# Patient Record
Sex: Female | Born: 1960 | Race: White | Hispanic: No | Marital: Married | Smoking: Never smoker
Health system: Southern US, Community
[De-identification: ages and names within clinical notes are randomized; demographics above are authoritative.]

## PROBLEM LIST (undated history)

## (undated) DIAGNOSIS — G43909 Migraine, unspecified, not intractable, without status migrainosus: Secondary | ICD-10-CM

## (undated) DIAGNOSIS — S82899A Other fracture of unspecified lower leg, initial encounter for closed fracture: Secondary | ICD-10-CM

## (undated) DIAGNOSIS — E785 Hyperlipidemia, unspecified: Secondary | ICD-10-CM

## (undated) DIAGNOSIS — K219 Gastro-esophageal reflux disease without esophagitis: Secondary | ICD-10-CM

## (undated) DIAGNOSIS — F419 Anxiety disorder, unspecified: Secondary | ICD-10-CM

## (undated) DIAGNOSIS — J45909 Unspecified asthma, uncomplicated: Secondary | ICD-10-CM

## (undated) DIAGNOSIS — T7840XA Allergy, unspecified, initial encounter: Secondary | ICD-10-CM

## (undated) DIAGNOSIS — I10 Essential (primary) hypertension: Secondary | ICD-10-CM

## (undated) HISTORY — DX: Allergy, unspecified, initial encounter: T78.40XA

## (undated) HISTORY — PX: CHOLECYSTECTOMY: SHX55

## (undated) HISTORY — DX: Migraine, unspecified, not intractable, without status migrainosus: G43.909

## (undated) HISTORY — DX: Anxiety disorder, unspecified: F41.9

## (undated) HISTORY — DX: Hyperlipidemia, unspecified: E78.5

## (undated) HISTORY — PX: HERNIA REPAIR: SHX51

## (undated) HISTORY — DX: Unspecified asthma, uncomplicated: J45.909

## (undated) HISTORY — DX: Essential (primary) hypertension: I10

## (undated) HISTORY — DX: Other fracture of unspecified lower leg, initial encounter for closed fracture: S82.899A

## (undated) HISTORY — PX: ABDOMINAL HYSTERECTOMY: SHX81

## (undated) HISTORY — DX: Gastro-esophageal reflux disease without esophagitis: K21.9

---

## 2001-05-26 ENCOUNTER — Ambulatory Visit (HOSPITAL_COMMUNITY): Admission: RE | Admit: 2001-05-26 | Discharge: 2001-05-26 | Payer: Self-pay | Admitting: General Surgery

## 2002-02-03 DIAGNOSIS — S82899A Other fracture of unspecified lower leg, initial encounter for closed fracture: Secondary | ICD-10-CM

## 2002-02-03 HISTORY — DX: Other fracture of unspecified lower leg, initial encounter for closed fracture: S82.899A

## 2002-02-04 ENCOUNTER — Emergency Department (HOSPITAL_COMMUNITY): Admission: EM | Admit: 2002-02-04 | Discharge: 2002-02-04 | Payer: Self-pay | Admitting: Emergency Medicine

## 2002-02-04 ENCOUNTER — Encounter: Payer: Self-pay | Admitting: Emergency Medicine

## 2002-06-01 ENCOUNTER — Ambulatory Visit (HOSPITAL_COMMUNITY): Admission: RE | Admit: 2002-06-01 | Discharge: 2002-06-01 | Payer: Self-pay | Admitting: Family Medicine

## 2002-06-01 ENCOUNTER — Encounter: Payer: Self-pay | Admitting: Family Medicine

## 2002-11-01 ENCOUNTER — Encounter: Payer: Self-pay | Admitting: Family Medicine

## 2002-11-01 ENCOUNTER — Ambulatory Visit (HOSPITAL_COMMUNITY): Admission: RE | Admit: 2002-11-01 | Discharge: 2002-11-01 | Payer: Self-pay | Admitting: Family Medicine

## 2004-01-24 ENCOUNTER — Ambulatory Visit (HOSPITAL_COMMUNITY): Admission: RE | Admit: 2004-01-24 | Discharge: 2004-01-24 | Payer: Self-pay | Admitting: Family Medicine

## 2005-03-25 ENCOUNTER — Ambulatory Visit (HOSPITAL_COMMUNITY): Admission: RE | Admit: 2005-03-25 | Discharge: 2005-03-25 | Payer: Self-pay | Admitting: Family Medicine

## 2006-04-11 ENCOUNTER — Ambulatory Visit (HOSPITAL_COMMUNITY): Admission: RE | Admit: 2006-04-11 | Discharge: 2006-04-11 | Payer: Self-pay | Admitting: Family Medicine

## 2007-01-06 ENCOUNTER — Ambulatory Visit (HOSPITAL_COMMUNITY): Admission: RE | Admit: 2007-01-06 | Discharge: 2007-01-06 | Payer: Self-pay | Admitting: Family Medicine

## 2007-01-17 ENCOUNTER — Encounter (HOSPITAL_COMMUNITY): Admission: RE | Admit: 2007-01-17 | Discharge: 2007-02-16 | Payer: Self-pay | Admitting: Family Medicine

## 2007-08-09 ENCOUNTER — Ambulatory Visit (HOSPITAL_COMMUNITY): Admission: RE | Admit: 2007-08-09 | Discharge: 2007-08-09 | Payer: Self-pay | Admitting: Family Medicine

## 2008-08-21 ENCOUNTER — Ambulatory Visit (HOSPITAL_COMMUNITY): Admission: RE | Admit: 2008-08-21 | Discharge: 2008-08-21 | Payer: Self-pay | Admitting: Family Medicine

## 2009-03-21 IMAGING — CT CT ABDOMEN W/ CM
1 of 3 series · 12 of 32 positions shown, 18 images · IV contrast (omnipaque)
Comparison: Ultrasound of 01/06/07.

CLINICAL DATA: 46-year-old with left upper quadrant abdominal pain, diarrhea, nausea after eating, abnormal ultrasound. 
 ABDOMEN CT WITH CONTRAST:
TECHNIQUE: Multidetector CT imaging of the abdomen was performed following the standard protocol during bolus administration of intravenous contrast.
 Contrast:  100 cc Omnipaque 300.

[Series 6: kidney delay 5.0 b40f · axial · delayed · 0.69mm/px · z∈[-297,-57]mm · 12 of 58 slices shown, 18 images]
[im 5/58  soft-tissue]
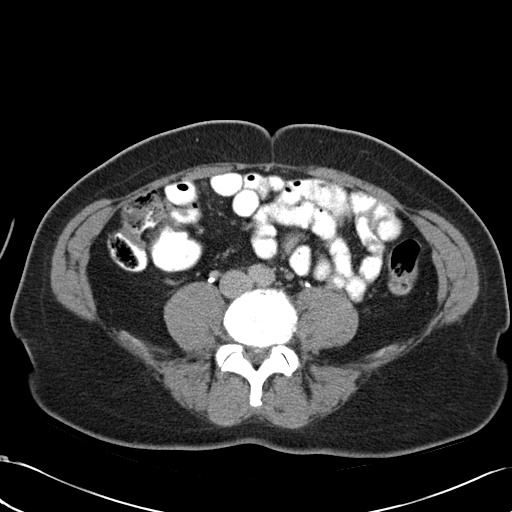
[im 5/58  bone]
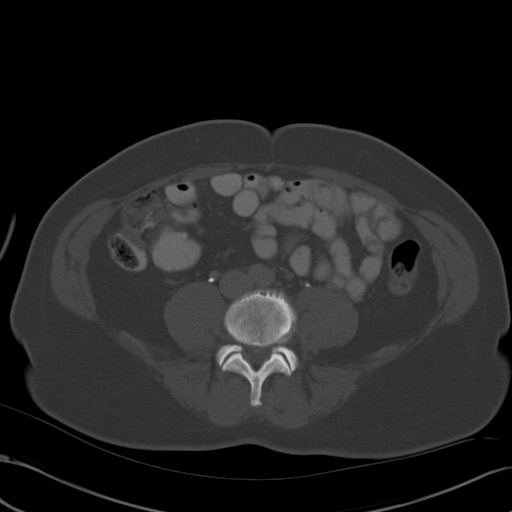
[im 9/58  soft-tissue]
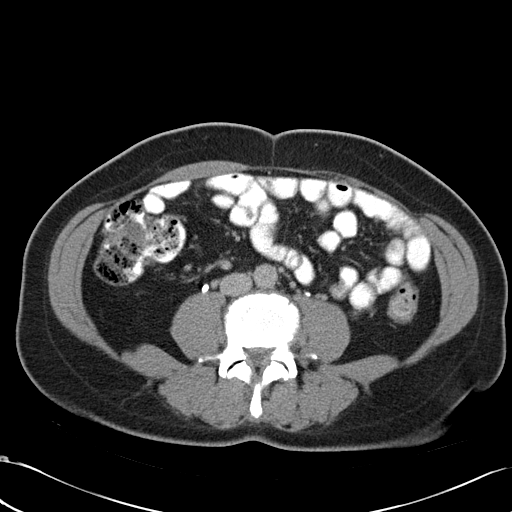
[im 14/58  soft-tissue]
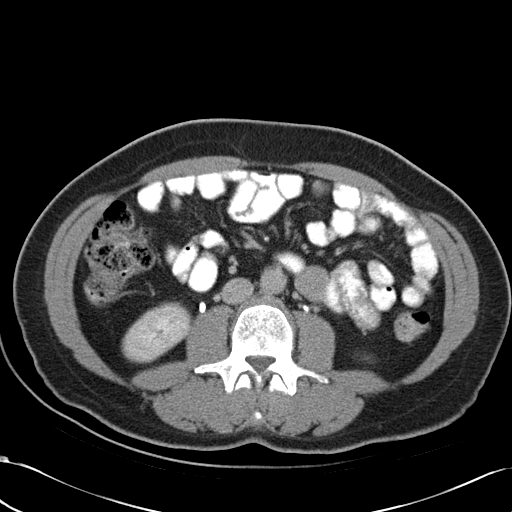
[im 18/58  soft-tissue]
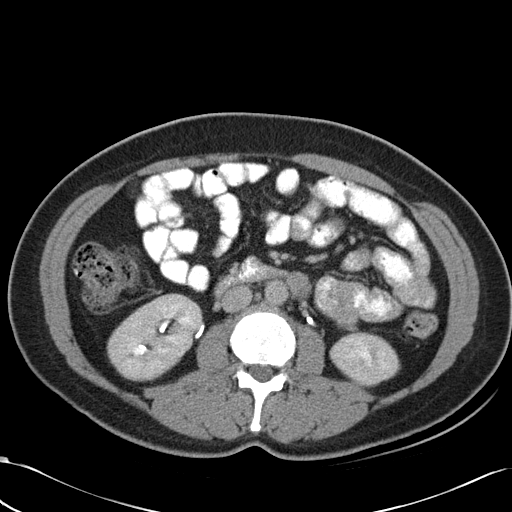
[im 22/58  soft-tissue]
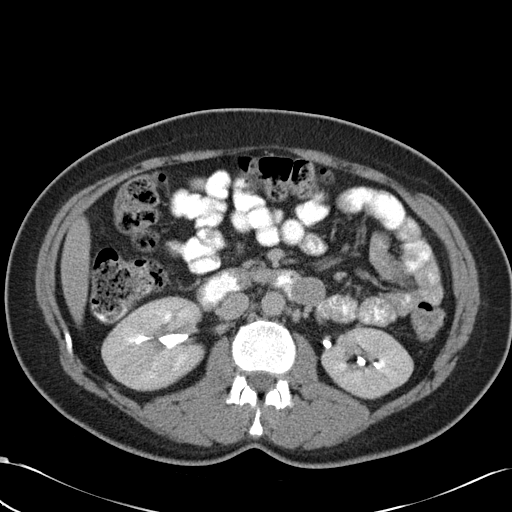
[im 27/58  soft-tissue]
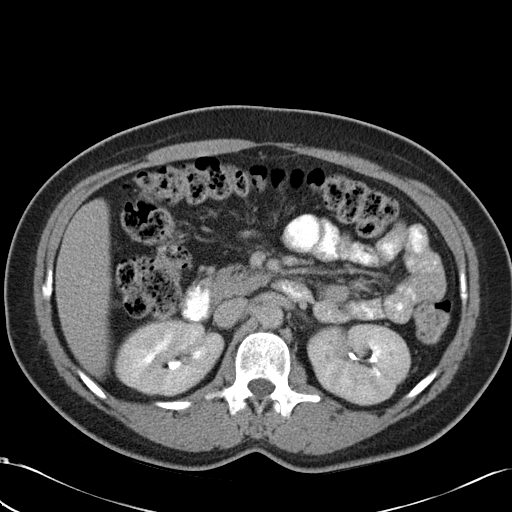
[im 31/58  soft-tissue]
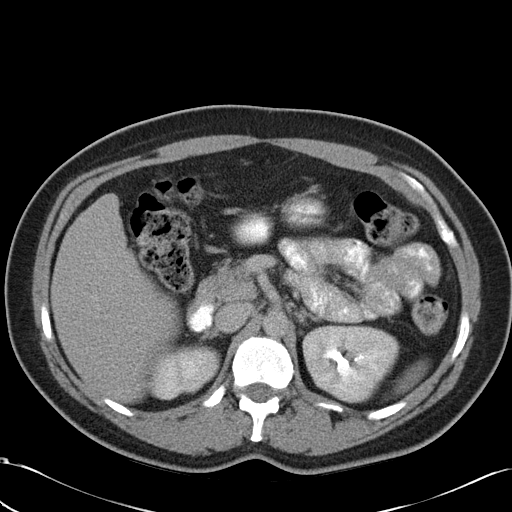
[im 36/58  soft-tissue]
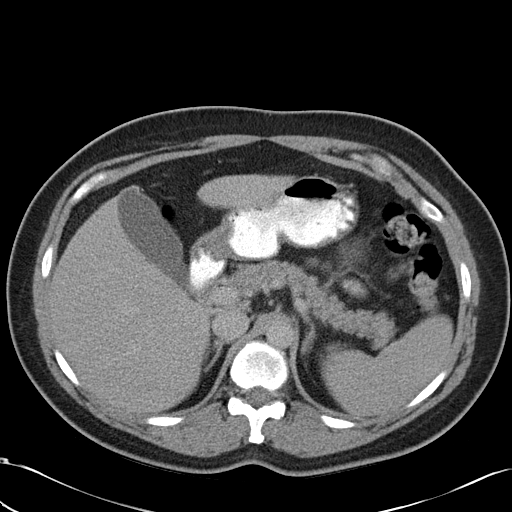
[im 40/58  soft-tissue]
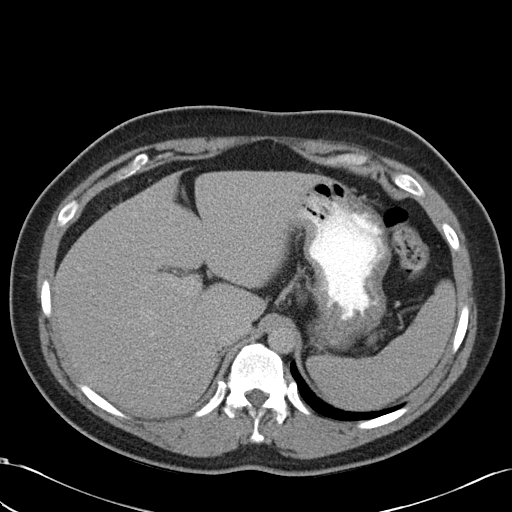
[im 40/58  lung]
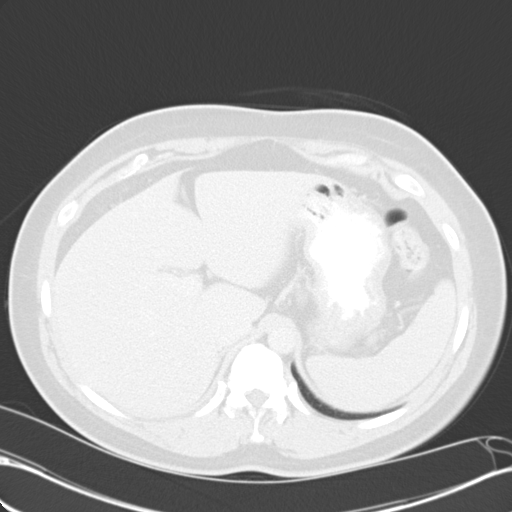
[im 40/58  bone]
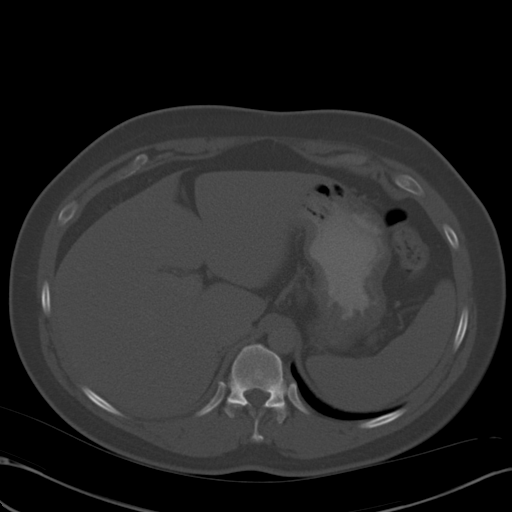
[im 44/58  soft-tissue]
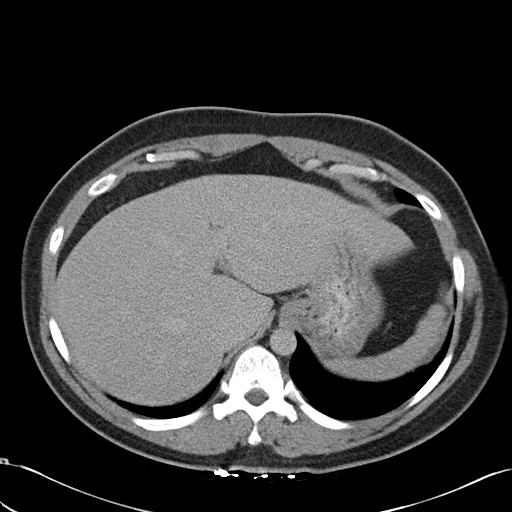
[im 44/58  lung]
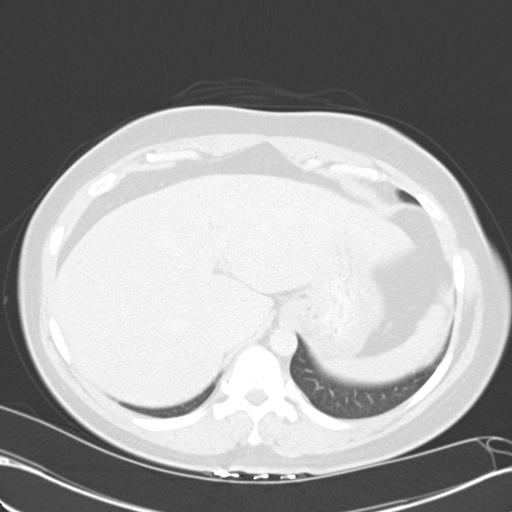
[im 49/58  soft-tissue]
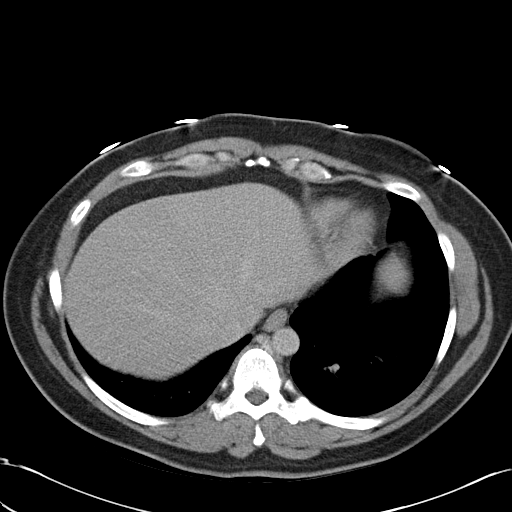
[im 49/58  lung]
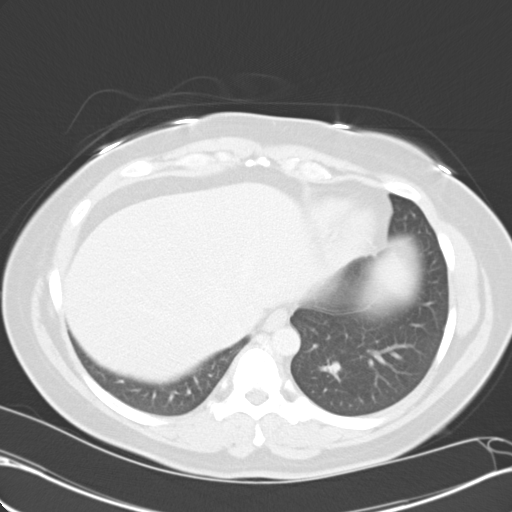
[im 53/58  soft-tissue]
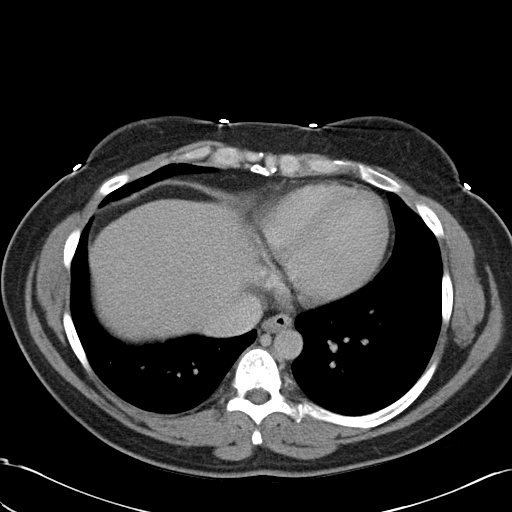
[im 53/58  lung]
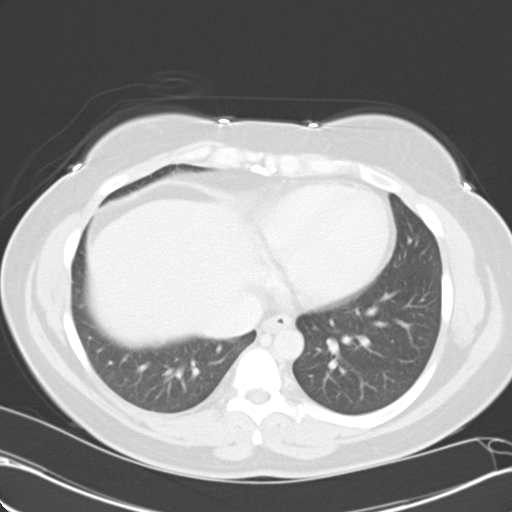

[12 of 32 positions shown; findings below may reference images not displayed]

FINDINGS: The lung bases are clear.  
 There is mild diffuse fatty infiltration of the liver, but no focal hepatic lesions or intrahepatic biliary dilatation.  There is mild impression on the far left lateral lobe of the liver due to the diaphragm.  I do not see any worrisome nodules or masses.  There is one tiny calcified granuloma in the left hepatic lobe and another tiny lesion in the right hepatic lobe.  The gallbladder appears normal.  The spleen is normal in size.  Small accessory spleen is noted.  The pancreas is unremarkable.  The adrenal glands and kidneys are normal.  
 The stomach, duodenum, small bowel and colon are unremarkable. 
 The aorta is normal in caliber.  No dissection.  No mesenteric or retroperitoneal masses or adenopathy.  No significant bony findings.
IMPRESSION: 1. Two small calcified granulomas in the liver but no worrisome hepatic lesions or biliary dilatation.  I think the findings on the ultrasound are due to diaphragmatic impressions on the far lateral aspect of the left hepatic lobe.  
 2. No abdominal mass lesions or adenopathy.

## 2009-04-24 ENCOUNTER — Ambulatory Visit (HOSPITAL_COMMUNITY): Admission: RE | Admit: 2009-04-24 | Discharge: 2009-04-24 | Payer: Self-pay | Admitting: Family Medicine

## 2010-01-22 ENCOUNTER — Ambulatory Visit (HOSPITAL_COMMUNITY): Admission: RE | Admit: 2010-01-22 | Discharge: 2010-01-22 | Payer: Self-pay | Admitting: Family Medicine

## 2010-11-19 IMAGING — US US TRANSVAGINAL NON-OB
1 series · 13 of 25 positions shown · non-contrast
Comparison: None.

CLINICAL DATA: Abdominal pain.  Heavy clotting during menses.  LMP
04/06/2009

TRANSABDOMINAL AND TRANSVAGINAL ULTRASOUND OF PELVIS
TECHNIQUE: Both transabdominal and transvaginal ultrasound
examinations of the pelvis were performed including evaluation of
the uterus, ovaries, adnexal regions, and pelvic cul-de-sac.

[Series 1: us transvaginal non-ob · 0.26mm/px · 13 of 93 slices shown]
[im 1/93]
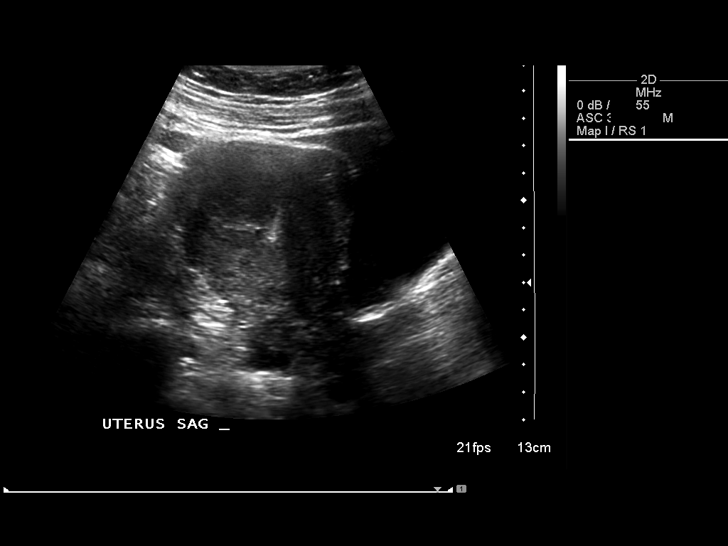
[im 8/93]
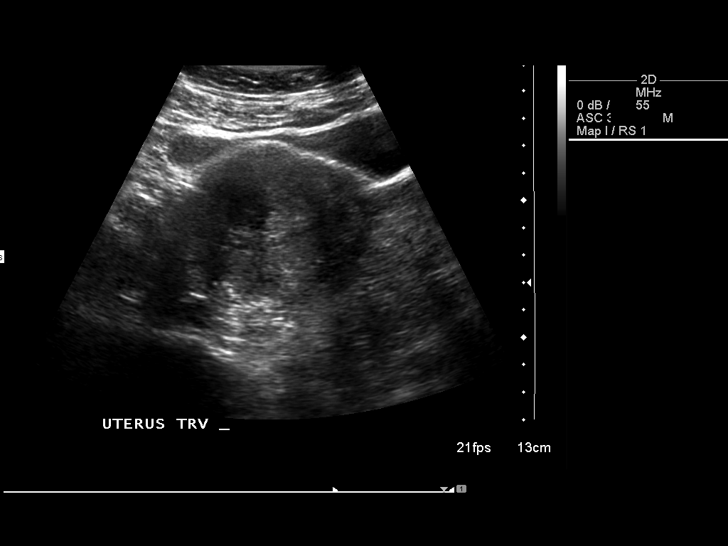
[im 16/93]
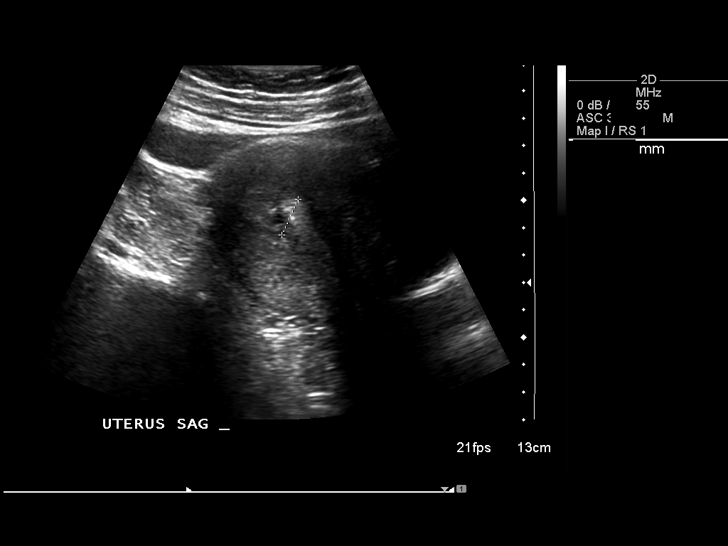
[im 24/93]
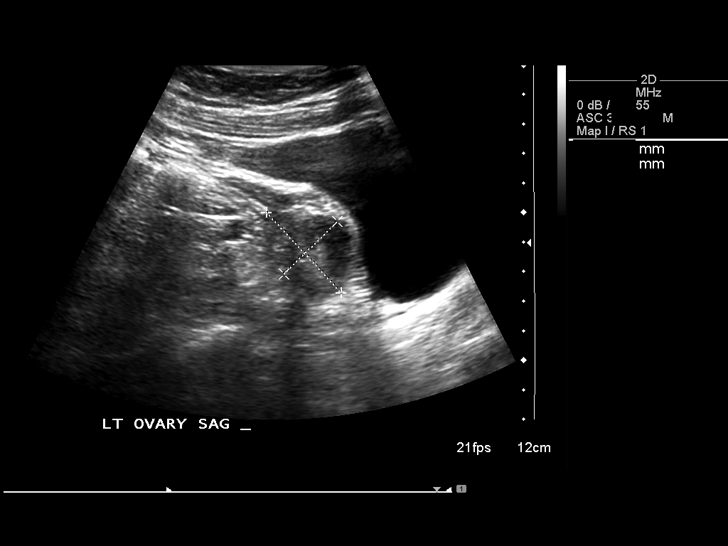
[im 31/93]
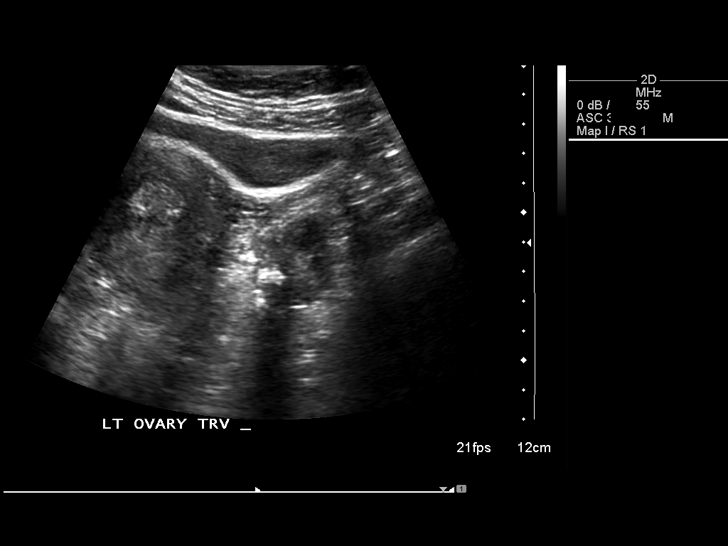
[im 39/93]
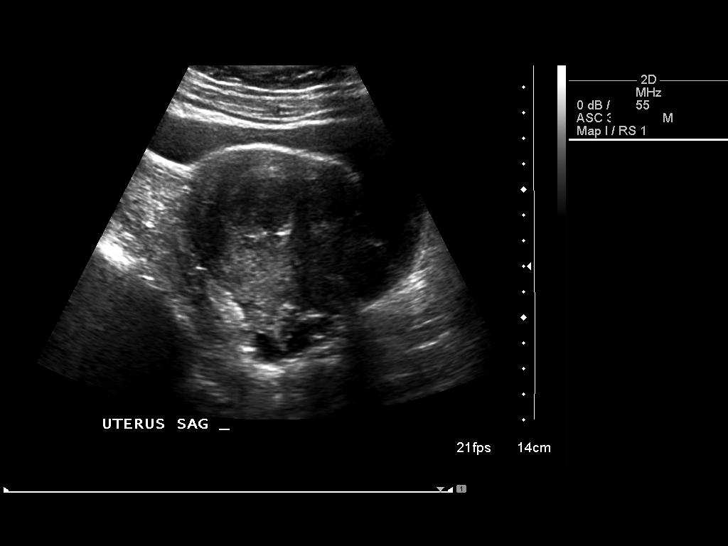
[im 47/93]
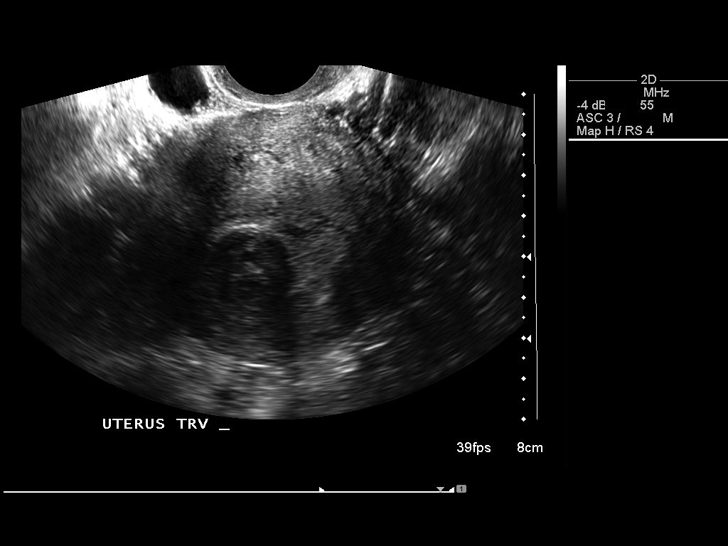
[im 54/93]
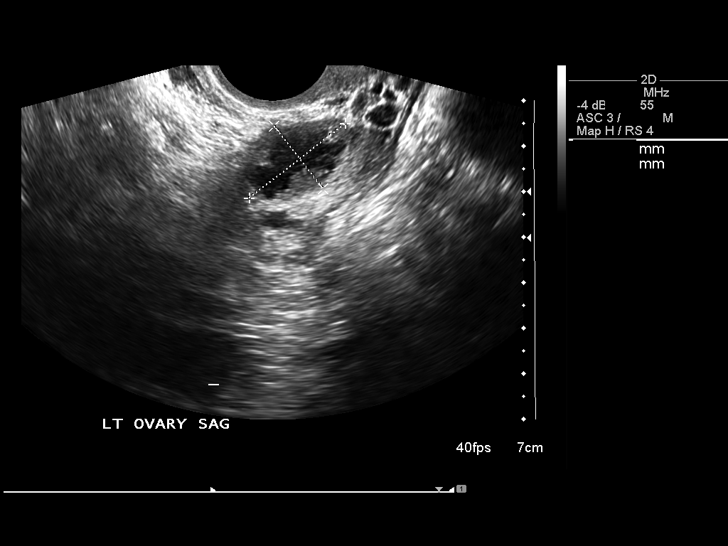
[im 62/93]
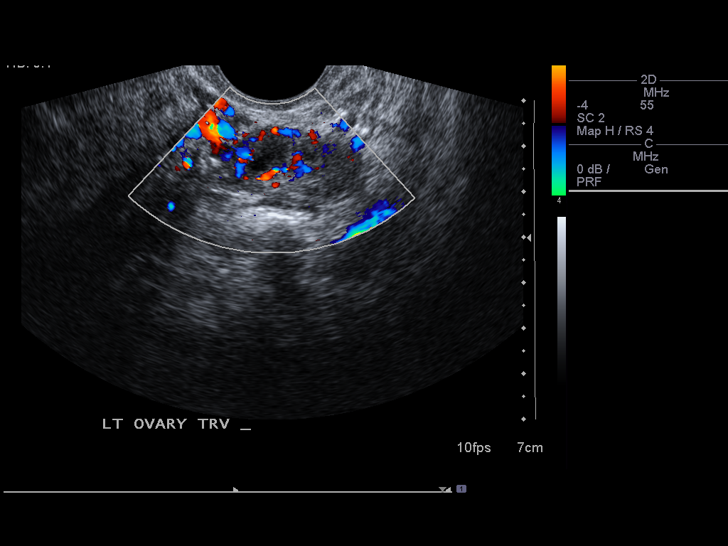
[im 70/93]
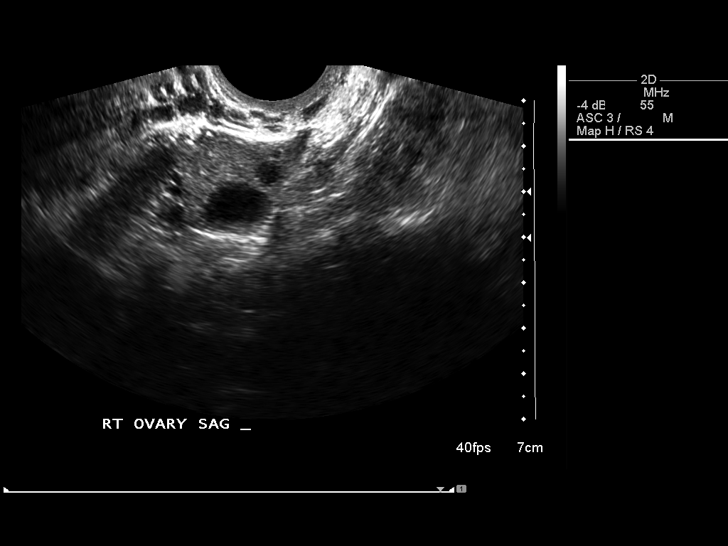
[im 77/93]
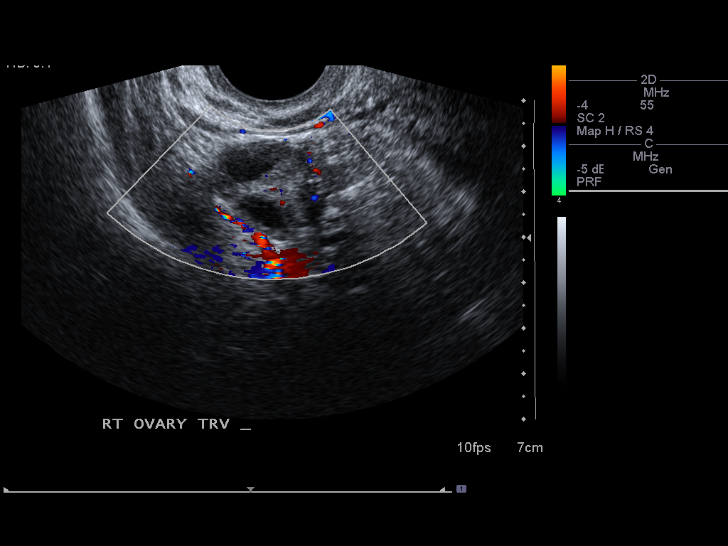
[im 85/93]
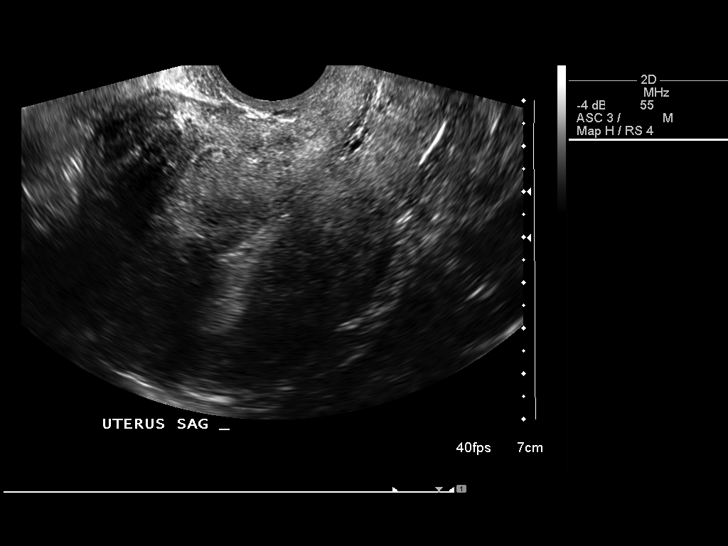
[im 93/93]
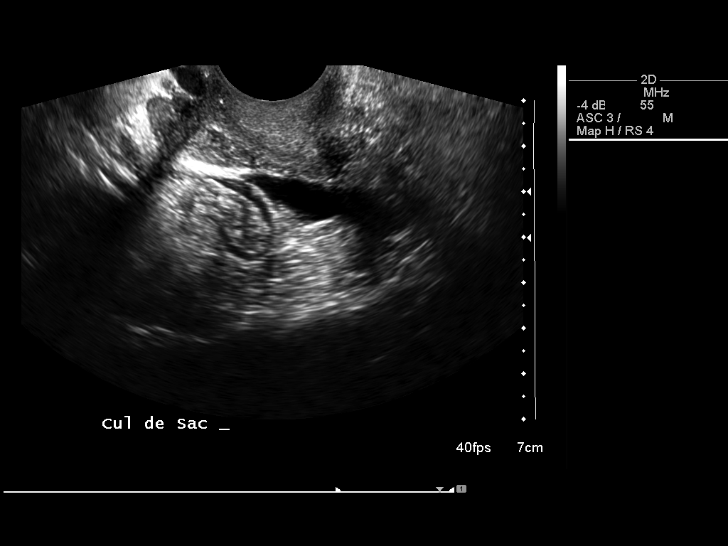

[13 of 25 positions shown; findings below may reference images not displayed]

FINDINGS: Uterus the uterus demonstrates a sagittal length of 10.7 cm, an AP
width of 6.7 cm and a transverse width of 7.3 cm.  Focal fibroids
are identified in the anterior upper uterine segment measuring
x 2.7 x 2.3 cm and mural in location, in the central upper uterine
segment measuring 2.3 x 2.2 by 1.9 cm with a significant submucosal
component suggested.

Endometrium the fundal portion of the endometrial lining is splayed
by the aforementioned fibroid with submucosal component.  An
accurate assessment of the endometrial lining is subsequently
compromised.  Further assessment of the degree of submucosal
involvement can be obtained with sono hysterography if desired.

Right Ovary has a normal appearance measuring 2.5 by 3.9 x 2.3 cm

Left Ovary measures 2.7 by 4.1 x 2.4 cm and contains a small
collapsing complex cyst demonstrating peripheral flow and likely
representing a hemorrhagic corpus luteum given the patient's LMP of
04/06/2009 and anticipated presecretory phase of the cycle.

Other Findings:  A small amount of simple free fluid is noted in
the cul-de-sac.  No separate adnexal masses are seen.
IMPRESSION: Fibroid uterus with at least one fibroid demonstrating a
significant submucosal component.  The degree of submucosal
involvement can be assessed with sono hysterography.  This should
be performed in the postsecretory phase of the cycle to allow
accurate evaluation of the endometrial lining as well.

Normal right ovary.  Probable hemorrhagic corpus luteum on the
left.  This can be assessed for resolution at the time of
sonohysterography if this is performed, or with rescanning in 4-6
weeks to confirm a functional nature to this finding.

## 2011-01-27 ENCOUNTER — Other Ambulatory Visit: Payer: Self-pay | Admitting: Family Medicine

## 2011-01-27 DIAGNOSIS — Z139 Encounter for screening, unspecified: Secondary | ICD-10-CM

## 2011-01-28 ENCOUNTER — Ambulatory Visit (HOSPITAL_COMMUNITY)
Admission: RE | Admit: 2011-01-28 | Discharge: 2011-01-28 | Disposition: A | Discharge status: Home / Self Care | Payer: BC Managed Care – PPO | Source: Ambulatory Visit | Attending: Family Medicine | Admitting: Family Medicine

## 2011-01-28 DIAGNOSIS — Z139 Encounter for screening, unspecified: Secondary | ICD-10-CM

## 2011-01-28 DIAGNOSIS — Z1231 Encounter for screening mammogram for malignant neoplasm of breast: Secondary | ICD-10-CM | POA: Insufficient documentation

## 2011-07-29 ENCOUNTER — Other Ambulatory Visit (INDEPENDENT_AMBULATORY_CARE_PROVIDER_SITE_OTHER): Payer: Self-pay | Admitting: *Deleted

## 2011-07-29 ENCOUNTER — Telehealth (INDEPENDENT_AMBULATORY_CARE_PROVIDER_SITE_OTHER): Payer: Self-pay | Admitting: *Deleted

## 2011-07-29 DIAGNOSIS — Z1211 Encounter for screening for malignant neoplasm of colon: Secondary | ICD-10-CM

## 2011-07-29 DIAGNOSIS — Z800 Family history of malignant neoplasm of digestive organs: Secondary | ICD-10-CM

## 2011-07-29 MED ORDER — PEG-KCL-NACL-NASULF-NA ASC-C 100 G PO SOLR: 1.0000 | Freq: Once | ORAL | Status: DC

## 2011-07-29 NOTE — Telephone Encounter (Signed)
Patient needs movi prep 

## 2011-09-15 ENCOUNTER — Telehealth (INDEPENDENT_AMBULATORY_CARE_PROVIDER_SITE_OTHER): Payer: Self-pay | Admitting: *Deleted

## 2011-09-15 NOTE — Telephone Encounter (Signed)
PCP/Requesting MD: scott luking  Name & DOB: Jasiya Troop 2060-11-14     Procedure: tcs  Reason/Indication:  Screening, fam hx colon ca  Has patient had this procedure before?  yes  If so, when, by whom and where?  05/2001  Is there a family history of colon cancer?  yes  Who?  What age when diagnosed?  Maternal grandmother  Is patient diabetic?   no      Does patient have prosthetic heart valve?  no  Do you have a pacemaker?  no  Has patient had joint replacement within last 12 months?  no  Is patient on Coumadin, Plavix and/or Aspirin? no  Medications: pantoprazole 40 mg daily, fenofibrate 160 mg daily, multi vit, calcium, vit c, glucosamine  Allergies: sulfur  Medication Adjustment: none  Procedure date & time: 10/14/11 at 830

## 2011-09-17 NOTE — Telephone Encounter (Signed)
agree

## 2011-10-01 ENCOUNTER — Encounter (HOSPITAL_COMMUNITY): Payer: Self-pay | Admitting: Pharmacy Technician

## 2011-10-13 MED ORDER — SODIUM CHLORIDE 0.45 % IV SOLN
Freq: Once | INTRAVENOUS | Status: AC
  Administered 2011-10-14: 08:00:00 via INTRAVENOUS

## 2011-10-14 ENCOUNTER — Encounter (HOSPITAL_COMMUNITY)
Admission: RE | Disposition: A | Discharge status: Home / Self Care | Payer: Self-pay | Source: Ambulatory Visit | Attending: Internal Medicine

## 2011-10-14 ENCOUNTER — Encounter (HOSPITAL_COMMUNITY): Payer: Self-pay | Admitting: *Deleted

## 2011-10-14 ENCOUNTER — Ambulatory Visit (HOSPITAL_COMMUNITY)
Admission: RE | Admit: 2011-10-14 | Discharge: 2011-10-14 | Disposition: A | Discharge status: Home / Self Care | Payer: BC Managed Care – PPO | Source: Ambulatory Visit | Attending: Internal Medicine | Admitting: Internal Medicine

## 2011-10-14 DIAGNOSIS — Z1211 Encounter for screening for malignant neoplasm of colon: Secondary | ICD-10-CM | POA: Insufficient documentation

## 2011-10-14 DIAGNOSIS — Z800 Family history of malignant neoplasm of digestive organs: Secondary | ICD-10-CM

## 2011-10-14 DIAGNOSIS — K644 Residual hemorrhoidal skin tags: Secondary | ICD-10-CM

## 2011-10-14 DIAGNOSIS — D126 Benign neoplasm of colon, unspecified: Secondary | ICD-10-CM | POA: Insufficient documentation

## 2011-10-14 HISTORY — PX: COLONOSCOPY: SHX5424

## 2011-10-14 SURGERY — COLONOSCOPY
Anesthesia: Moderate Sedation

## 2011-10-14 MED ORDER — STERILE WATER FOR IRRIGATION IR SOLN
Status: DC | PRN
  Administered 2011-10-14: 09:00:00

## 2011-10-14 MED ORDER — MIDAZOLAM HCL 5 MG/5ML IJ SOLN
INTRAMUSCULAR | Status: AC
  Filled 2011-10-14: qty 10

## 2011-10-14 MED ORDER — MEPERIDINE HCL 50 MG/ML IJ SOLN
INTRAMUSCULAR | Status: AC
  Filled 2011-10-14: qty 1

## 2011-10-14 MED ORDER — MIDAZOLAM HCL 5 MG/5ML IJ SOLN
INTRAMUSCULAR | Status: DC | PRN
  Administered 2011-10-14: 3 mg via INTRAVENOUS
  Administered 2011-10-14 (×2): 2 mg via INTRAVENOUS
  Administered 2011-10-14: 1 mg via INTRAVENOUS
  Administered 2011-10-14: 2 mg via INTRAVENOUS

## 2011-10-14 MED ORDER — MEPERIDINE HCL 50 MG/ML IJ SOLN
INTRAMUSCULAR | Status: DC | PRN
  Administered 2011-10-14 (×2): 25 mg via INTRAVENOUS

## 2011-10-14 NOTE — Op Note (Signed)
COLONOSCOPY PROCEDURE REPORT  PATIENT:  Faith Mahoney  MR#:  [id number] Birthdate:  Sep 20, 1960, 51 y.o., female Endoscopist:  Dr. Malissa Hippo, MD Referred By:  Dr. Lilyan Punt, MD Procedure Date: 10/14/2011  Procedure:   Colonoscopy  Indications:  Patient is 51 year old Caucasian female who is undergoing screening colonoscopy. Family history is positive for colon carcinoma in two second-degree relatives late onset. Patient's last exam was 10 years ago  Informed Consent:  The procedure and risks were reviewed with the patient and informed consent was obtained.  Medications:  Demerol 50 mg IV Versed 10 mg IV  Description of procedure:  After a digital rectal exam was performed, that colonoscope was advanced from the anus through the rectum and colon to the area of the cecum, ileocecal valve and appendiceal orifice. The cecum was deeply intubated. These structures were well-seen and photographed for the record. From the level of the cecum and ileocecal valve, the scope was slowly and cautiously withdrawn. The mucosal surfaces were carefully surveyed utilizing scope tip to flexion to facilitate fold flattening as needed. The scope was pulled down into the rectum where a thorough exam including retroflexion was performed.  Findings:   Prep excellent. 3 mm polyp ablated via cold biopsy from splenic flexure. Rest of the colonic mucosa was normal. Normal rectal mucosa. Small hemorrhoids below the dentate line.  Therapeutic/Diagnostic Maneuvers Performed:  See above  Complications:  None  Cecal Withdrawal Time:  12 minutes  Impression:  Examination performed to cecum. 3 mm polyp of there are cold biopsy from splenic flexure. Small external hemorrhoids.  Recommendations:  Standard instructions given. I will contact patient with biopsy results.  Faith Mahoney U  10/14/2011 9:09 AM  CC: Dr. Lilyan Punt, MD & Dr. Bonnetta Barry ref. provider found

## 2011-10-14 NOTE — H&P (Signed)
Faith Mahoney is an 51 y.o. female.   Chief Complaint: Patient is here for colonoscopy. HPI: Patient is 51 year old Caucasian female who is here for screening colonoscopy. She denies abdominal pain change in bowel habits or rectal bleeding. Her last exam was 10 years ago. Family history is negative for colon carcinoma in a first-degree relative. Both maternal grandparents had colon carcinoma; maternal grandfather was in the 9s and grandmother was in her 13s.  Past Medical History  Diagnosis Date  . PONV (postoperative nausea and vomiting)     No past surgical history on file.  No family history on file. Social History:  does not have a smoking history on file. She does not have any smokeless tobacco history on file. Her alcohol and drug histories not on file.  Allergies:  Allergies  Allergen Reactions  . Sulfa Antibiotics Hives  . Peanuts (Peanut Oil) Itching and Rash    Can tolerate them every now and then    Medications Prior to Admission  Medication Sig Dispense Refill  . Calcium Carbonate-Vitamin D (CALCIUM + D PO) Take 1 tablet by mouth daily.      Marland Kitchen glucosamine-chondroitin 500-400 MG tablet Take 1 tablet by mouth daily.      Marland Kitchen Ketotifen Fumarate (ALLERGY EYE DROPS OP) Apply 1-2 drops to eye as needed. For allergic eyes      . loratadine (CLARITIN) 10 MG tablet Take 10 mg by mouth daily as needed. For allergies      . Menthol, Topical Analgesic, (BIOFREEZE ROLL-ON) 4 % GEL Apply 1 application topically as needed. For pain      . Multiple Vitamin (MULTIVITAMIN WITH MINERALS) TABS Take 1 tablet by mouth daily.      . naproxen sodium (ANAPROX) 220 MG tablet Take 220 mg by mouth every 8 (eight) hours as needed. For pain      . pantoprazole (PROTONIX) 40 MG tablet Take 40 mg by mouth daily.      . peg 3350 powder (MOVIPREP) SOLR Take 1 kit (100 g total) by mouth once.  1 kit  0  . vitamin C (ASCORBIC ACID) 500 MG tablet Take 500 mg by mouth daily.        No results found for  this or any previous visit (from the past 48 hour(s)). No results found.  ROS  Blood pressure 128/92, pulse 79, temperature 98.1 F (36.7 C), temperature source Oral, resp. rate 21, height 5\' 3"  (1.6 m), weight 158 lb (71.668 kg). Physical Exam  Constitutional: She appears well-developed and well-nourished.  HENT:  Mouth/Throat: Oropharynx is clear and moist.  Eyes: Conjunctivae are normal. No scleral icterus.  Neck: No thyromegaly present.  Cardiovascular: Normal rate, regular rhythm and normal heart sounds.   No murmur heard. Respiratory: Effort normal and breath sounds normal.  GI: Soft. She exhibits no distension and no mass. There is no tenderness.  Musculoskeletal: She exhibits no edema.  Lymphadenopathy:    She has no cervical adenopathy.  Neurological: She is alert.  Skin: Skin is warm and dry.     Assessment/Plan Screening colonoscopy. Family history of colon carcinoma in 2 second degree relatives late onset.  REHMAN,NAJEEB U 10/14/2011, 8:33 AM

## 2011-10-18 ENCOUNTER — Encounter (HOSPITAL_COMMUNITY): Payer: Self-pay | Admitting: Internal Medicine

## 2011-10-19 ENCOUNTER — Encounter (INDEPENDENT_AMBULATORY_CARE_PROVIDER_SITE_OTHER): Payer: Self-pay | Admitting: *Deleted

## 2012-01-25 ENCOUNTER — Other Ambulatory Visit: Payer: Self-pay | Admitting: Family Medicine

## 2012-01-25 DIAGNOSIS — Z139 Encounter for screening, unspecified: Secondary | ICD-10-CM

## 2012-02-01 ENCOUNTER — Ambulatory Visit (HOSPITAL_COMMUNITY)
Admission: RE | Admit: 2012-02-01 | Discharge: 2012-02-01 | Disposition: A | Discharge status: Home / Self Care | Payer: BC Managed Care – PPO | Source: Ambulatory Visit | Attending: Family Medicine | Admitting: Family Medicine

## 2012-02-01 DIAGNOSIS — Z139 Encounter for screening, unspecified: Secondary | ICD-10-CM

## 2012-02-01 DIAGNOSIS — Z1231 Encounter for screening mammogram for malignant neoplasm of breast: Secondary | ICD-10-CM | POA: Insufficient documentation

## 2012-02-01 LAB — HM MAMMOGRAPHY: HM MAMMO: NORMAL

## 2012-10-05 ENCOUNTER — Other Ambulatory Visit (HOSPITAL_COMMUNITY): Payer: Self-pay | Admitting: Family Medicine

## 2013-01-30 ENCOUNTER — Other Ambulatory Visit: Payer: Self-pay | Admitting: Family Medicine

## 2013-01-30 DIAGNOSIS — Z139 Encounter for screening, unspecified: Secondary | ICD-10-CM

## 2013-02-06 ENCOUNTER — Ambulatory Visit (HOSPITAL_COMMUNITY): Payer: BC Managed Care – PPO

## 2013-04-23 ENCOUNTER — Encounter: Payer: Self-pay | Admitting: *Deleted

## 2013-04-25 ENCOUNTER — Ambulatory Visit (INDEPENDENT_AMBULATORY_CARE_PROVIDER_SITE_OTHER): Payer: BC Managed Care – PPO | Admitting: Nurse Practitioner

## 2013-04-25 ENCOUNTER — Encounter: Payer: Self-pay | Admitting: Nurse Practitioner

## 2013-04-25 VITALS — BP 132/88 | Ht 63.0 in | Wt 170.8 lb

## 2013-04-25 DIAGNOSIS — K219 Gastro-esophageal reflux disease without esophagitis: Secondary | ICD-10-CM

## 2013-04-25 DIAGNOSIS — Z0000 Encounter for general adult medical examination without abnormal findings: Secondary | ICD-10-CM

## 2013-04-25 DIAGNOSIS — Z01419 Encounter for gynecological examination (general) (routine) without abnormal findings: Secondary | ICD-10-CM

## 2013-04-25 MED ORDER — PANTOPRAZOLE SODIUM 40 MG PO TBEC: 40.0000 mg | DELAYED_RELEASE_TABLET | Freq: Two times a day (BID) | ORAL | Status: DC

## 2013-04-25 MED ORDER — ALPRAZOLAM 0.5 MG PO TABS: 0.5000 mg | ORAL_TABLET | Freq: Two times a day (BID) | ORAL | Status: DC | PRN

## 2013-04-25 MED ORDER — FENOFIBRATE 160 MG PO TABS: 160.0000 mg | ORAL_TABLET | Freq: Every day | ORAL | Status: DC

## 2013-04-30 ENCOUNTER — Encounter: Payer: Self-pay | Admitting: Nurse Practitioner

## 2013-04-30 DIAGNOSIS — K219 Gastro-esophageal reflux disease without esophagitis: Secondary | ICD-10-CM | POA: Insufficient documentation

## 2013-04-30 DIAGNOSIS — E785 Hyperlipidemia, unspecified: Secondary | ICD-10-CM | POA: Insufficient documentation

## 2013-04-30 NOTE — Progress Notes (Signed)
   Subjective:    Patient ID: Faith Mahoney, female    DOB: 1961-02-22, 53 y.o.   MRN: [id number]  HPI Presents for wellness check up. Gets regular vision and dental exams. Would like to try to get Zostavax if insurance will cover. Gets labs at work. To send copy. Has had increased reflux lately. Taking daily Protonix. Some occas anxiety.    Review of Systems  Constitutional: Negative for activity change, appetite change and fatigue.  HENT: Negative for dental problem, ear pain, sinus pressure and sore throat.   Respiratory: Negative for cough, chest tightness, shortness of breath and wheezing.   Cardiovascular: Negative for chest pain and leg swelling.  Gastrointestinal: Negative for nausea, vomiting, abdominal pain, diarrhea and constipation.  Genitourinary: Negative for dysuria, urgency, frequency, vaginal discharge, enuresis, difficulty urinating and pelvic pain.  Psychiatric/Behavioral: The patient is nervous/anxious.        Objective:   Physical Exam  Vitals reviewed. Constitutional: She is oriented to person, place, and time. She appears well-developed. No distress.  HENT:  Right Ear: External ear normal.  Left Ear: External ear normal.  Mouth/Throat: Oropharynx is clear and moist.  Neck: Normal range of motion. Neck supple. No tracheal deviation present. No thyromegaly present.  Cardiovascular: Normal rate, regular rhythm and normal heart sounds.  Exam reveals no gallop.   No murmur heard. Pulmonary/Chest: Effort normal and breath sounds normal.  Abdominal: Soft. She exhibits no distension. There is no tenderness.  Genitourinary: Vagina normal. No vaginal discharge found.  Musculoskeletal: She exhibits no edema.  Lymphadenopathy:    She has no cervical adenopathy.  Neurological: She is alert and oriented to person, place, and time.  Skin: Skin is warm and dry. No rash noted.  Psychiatric: She has a normal mood and affect. Her behavior is normal.  Breast exam: no  masses noted; axillae no adenopathy. External GU: no lesions or rash. Bimanual exam: normal; no tenderness. Rectal exam: normal; no stool for hemoccult.        Assessment & Plan:  Well woman exam - Plan: POC Hemoccult Bld/Stl (3-Cd Home Screen)  GERD (gastroesophageal reflux disease)  Increase Protonix to BID. Call back in 2-3 weeks if no better. May go back to once a day dosing when better. Patient plans to schedule her mammogram. Send copy of labs. Recommend healthy diet, regular activity and vitamin D/calcium supplement. Next PE in one year. Routine follow-up in 6 months.

## 2013-12-11 ENCOUNTER — Other Ambulatory Visit (HOSPITAL_COMMUNITY): Payer: Self-pay | Admitting: Family Medicine

## 2014-02-22 ENCOUNTER — Encounter: Payer: Self-pay | Admitting: Family Medicine

## 2014-05-08 ENCOUNTER — Ambulatory Visit (INDEPENDENT_AMBULATORY_CARE_PROVIDER_SITE_OTHER): Payer: BLUE CROSS/BLUE SHIELD | Admitting: Nurse Practitioner

## 2014-05-08 ENCOUNTER — Encounter: Payer: Self-pay | Admitting: Nurse Practitioner

## 2014-05-08 VITALS — BP 148/94 | Ht 63.0 in | Wt 169.0 lb

## 2014-05-08 DIAGNOSIS — Z0000 Encounter for general adult medical examination without abnormal findings: Secondary | ICD-10-CM

## 2014-05-08 DIAGNOSIS — I10 Essential (primary) hypertension: Secondary | ICD-10-CM | POA: Insufficient documentation

## 2014-05-08 DIAGNOSIS — E785 Hyperlipidemia, unspecified: Secondary | ICD-10-CM

## 2014-05-08 MED ORDER — LISINOPRIL 5 MG PO TABS: 5.0000 mg | ORAL_TABLET | Freq: Every day | ORAL | Status: DC

## 2014-05-08 MED ORDER — ROSUVASTATIN CALCIUM 5 MG PO TABS: 5.0000 mg | ORAL_TABLET | Freq: Every day | ORAL | Status: DC

## 2014-05-08 MED ORDER — EPINEPHRINE 0.3 MG/0.3ML IJ SOAJ: 0.3000 mg | Freq: Once | INTRAMUSCULAR | Status: DC

## 2014-05-08 NOTE — Progress Notes (Signed)
Subjective:    Patient ID: Faith Mahoney, female    DOB: 1960-11-09, 54 y.o.   MRN: [id number]  HPI presents for her wellness exam. Has had hysterectomy, no oophorectomy. Having "hot flashes" more at night but some during the day. While shopping the other day, had intense sweating, heart "pounding" and had to go home. No CP/ischemic type pain or SOB. Stressful job. Elevated BP outside of office. Healthy diet. Regular exercise. Regular vision and dental exams. Has labs with her today. Married, same sexual partner.    Review of Systems  Constitutional: Positive for fatigue. Negative for fever, activity change and appetite change.  HENT: Negative for dental problem, ear pain, sinus pressure and sore throat.   Respiratory: Negative for cough, chest tightness, shortness of breath and wheezing.   Cardiovascular: Positive for palpitations. Negative for chest pain.  Gastrointestinal: Negative for nausea, vomiting, abdominal pain, diarrhea, constipation, blood in stool and abdominal distention.  Genitourinary: Negative for dysuria, urgency, frequency, vaginal discharge, enuresis, difficulty urinating, genital sores and pelvic pain.       Objective:   Physical Exam  Constitutional: She is oriented to person, place, and time. She appears well-developed. No distress.  HENT:  Right Ear: External ear normal.  Left Ear: External ear normal.  Mouth/Throat: Oropharynx is clear and moist.  Neck: Normal range of motion. Neck supple. No tracheal deviation present. No thyromegaly present.  Cardiovascular: Normal rate, regular rhythm and normal heart sounds.  Exam reveals no gallop.   No murmur heard. Pulmonary/Chest: Effort normal and breath sounds normal.  Abdominal: Soft. She exhibits no distension. There is no tenderness.  Genitourinary: Vagina normal. No vaginal discharge found.  External GU: no rashes or lesions. Vagina: no discharge. Bimanual exam: no tenderness or obvious masses. Rectal exam:  no masses; no stool for hemoccult.   Musculoskeletal: She exhibits no edema.  Lymphadenopathy:    She has no cervical adenopathy.  Neurological: She is alert and oriented to person, place, and time.  Skin: Skin is warm and dry. No rash noted.  Psychiatric: She has a normal mood and affect. Her behavior is normal.  Vitals reviewed. Breast exam: slightly dense tissue; no masses; axillae no adenopathy.  Labs dated 04/11/14: TC 248; TG 251; HDL 32; LDL 166. See scanned labs.        Assessment & Plan:  Routine general medical examination at a health care facility  Essential hypertension  Hyperlipidemia  Discussed risks associated with uncontrolled HTN and lipids.  Meds ordered this encounter  Medications  . clobetasol cream (TEMOVATE) 0.05 %    Sig: Apply 1 application topically 2 (two) times daily.  . Omega-3 Fatty Acids (OMEGA 3 PO)    Sig: Take by mouth.  Marland Kitchen lisinopril (PRINIVIL,ZESTRIL) 5 MG tablet    Sig: Take 1 tablet (5 mg total) by mouth daily.    Dispense:  90 tablet    Refill:  1    Order Specific Question:  Supervising Provider    Answer:  Mikey Kirschner [[id number]]  . rosuvastatin (CRESTOR) 5 MG tablet    Sig: Take 1 tablet (5 mg total) by mouth at bedtime.    Dispense:  30 tablet    Refill:  2    Order Specific Question:  Supervising Provider    Answer:  Mikey Kirschner [[id number]]  . EPINEPHrine 0.3 mg/0.3 mL IJ SOAJ injection    Sig: Inject 0.3 mLs (0.3 mg total) into the muscle once.    Dispense:  1  Device    Refill:  2    Please dispense name brand; she has a coupon    Order Specific Question:  Supervising Provider    Answer:  Mikey Kirschner [[id number]]   Stop Fenofibrate. Because we will be switching labs next week, patient to call back for repeat lipid and liver profiles in 8-10 weeks. Monitor BP outside office. Discussed importance of stress reduction. Recommend natural supplements for hot flashes. Call back if persists. Return in about 6 months (around 11/06/2014)  for recheck BP and lipids. Next PE in one year.

## 2014-05-08 NOTE — Patient Instructions (Addendum)
**  CALL BACK IN 8-10 WEEKS FOR LABS THROUGH [facility]** Soy Flaxseed Black cohosh

## 2014-05-14 ENCOUNTER — Other Ambulatory Visit: Payer: Self-pay | Admitting: Family Medicine

## 2014-05-14 ENCOUNTER — Other Ambulatory Visit: Payer: Self-pay | Admitting: Nurse Practitioner

## 2014-05-14 NOTE — Telephone Encounter (Signed)
Last seen 05/08/14

## 2014-05-15 NOTE — Telephone Encounter (Signed)
May refill this +2 additional refills 

## 2014-08-02 ENCOUNTER — Telehealth: Payer: Self-pay | Admitting: Family Medicine

## 2014-08-02 DIAGNOSIS — R5383 Other fatigue: Secondary | ICD-10-CM

## 2014-08-02 DIAGNOSIS — E785 Hyperlipidemia, unspecified: Secondary | ICD-10-CM

## 2014-08-02 DIAGNOSIS — Z79899 Other long term (current) drug therapy: Secondary | ICD-10-CM

## 2014-08-02 NOTE — Telephone Encounter (Signed)
bw orders ready. Pt notified. 

## 2014-08-02 NOTE — Telephone Encounter (Signed)
Pt called requesting lab orders to be sent over. There are no prior labs in epic.

## 2014-08-02 NOTE — Telephone Encounter (Signed)
Met 7 liver lipid vit d

## 2015-02-21 ENCOUNTER — Encounter: Payer: Self-pay | Admitting: Family Medicine

## 2015-03-03 ENCOUNTER — Other Ambulatory Visit: Payer: Self-pay | Admitting: Nurse Practitioner

## 2015-05-05 ENCOUNTER — Encounter: Payer: BLUE CROSS/BLUE SHIELD | Admitting: Nurse Practitioner

## 2015-05-14 ENCOUNTER — Encounter: Payer: Self-pay | Admitting: Nurse Practitioner

## 2015-05-14 ENCOUNTER — Ambulatory Visit (INDEPENDENT_AMBULATORY_CARE_PROVIDER_SITE_OTHER): Payer: BLUE CROSS/BLUE SHIELD | Admitting: Nurse Practitioner

## 2015-05-14 VITALS — BP 148/96 | Temp 98.8°F | Ht 63.5 in | Wt 169.0 lb

## 2015-05-14 DIAGNOSIS — E785 Hyperlipidemia, unspecified: Secondary | ICD-10-CM

## 2015-05-14 DIAGNOSIS — R739 Hyperglycemia, unspecified: Secondary | ICD-10-CM

## 2015-05-14 DIAGNOSIS — Z0000 Encounter for general adult medical examination without abnormal findings: Secondary | ICD-10-CM

## 2015-05-14 LAB — POCT GLYCOSYLATED HEMOGLOBIN (HGB A1C): HEMOGLOBIN A1C: 5.4

## 2015-05-16 ENCOUNTER — Encounter: Payer: Self-pay | Admitting: Nurse Practitioner

## 2015-05-16 MED ORDER — ROSUVASTATIN CALCIUM 5 MG PO TABS: 5.0000 mg | ORAL_TABLET | Freq: Every day | ORAL | Status: DC

## 2015-05-16 MED ORDER — PANTOPRAZOLE SODIUM 40 MG PO TBEC: DELAYED_RELEASE_TABLET | ORAL | Status: DC

## 2015-05-16 MED ORDER — LISINOPRIL 5 MG PO TABS
5.0000 mg | ORAL_TABLET | Freq: Every day | ORAL | Status: DC
Start: 2015-05-16 — End: 2016-02-04

## 2015-05-16 NOTE — Progress Notes (Signed)
Subjective:    Patient ID: Faith Mahoney, female    DOB: [birth year], 55 y.o.   MRN: [id number]  HPI  Presents for her wellness exam. Married, same sexual partner. Has had a hysterectomy. Very active, regular exercise. Regular vision and dental exams. Takes daily multivitamin. Has been off her Crestor for about 8 months, has been taking red yeast rice. Caused moderate migratory muscle aches. Reflux stable; takes Protonix prn.     Review of Systems  Constitutional: Negative for activity change, appetite change and fatigue.  HENT: Negative for dental problem, ear pain, sinus pressure and sore throat.   Respiratory: Negative for cough, chest tightness, shortness of breath and wheezing.   Cardiovascular: Negative for chest pain.  Gastrointestinal: Negative for nausea, vomiting, abdominal pain, diarrhea, constipation, blood in stool and abdominal distention.  Genitourinary: Negative for dysuria, urgency, frequency, vaginal discharge, enuresis, difficulty urinating, genital sores and pelvic pain.       Objective:   Physical Exam  Constitutional: She is oriented to person, place, and time. She appears well-developed. No distress.  HENT:  Right Ear: External ear normal.  Left Ear: External ear normal.  Mouth/Throat: Oropharynx is clear and moist.  Neck: Normal range of motion. Neck supple. No tracheal deviation present. No thyromegaly present.  Cardiovascular: Normal rate, regular rhythm and normal heart sounds.  Exam reveals no gallop.   No murmur heard. Pulmonary/Chest: Effort normal and breath sounds normal.  Abdominal: Soft. She exhibits no distension. There is no tenderness.  Genitourinary: Vagina normal. No vaginal discharge found.  External GU no rashes or lesions. Vagina slightly pale with clear discharge. Bimanual exam no tenderness or obvious masses. Rectal exam no masses, stool for Hemoccult.  Musculoskeletal: She exhibits no edema.  Lymphadenopathy:    She has no cervical  adenopathy.  Neurological: She is alert and oriented to person, place, and time.  Skin: Skin is warm and dry. No rash noted.  Psychiatric: She has a normal mood and affect. Her behavior is normal.  Vitals reviewed. Breast exam: areas of dense tissue; no masses; axillae no adenopathy.  Labs from work dated 04/17/15: LDL 182; HDL 32; TC 250; TG 182. See scanned report.  Results for orders placed or performed in visit on 05/14/15  POCT glycosylated hemoglobin (Hb A1C)  Result Value Ref Range   Hemoglobin A1C 5.4         Assessment & Plan:   Problem List Items Addressed This Visit      Other   Hyperlipidemia (Chronic)   Relevant Medications   lisinopril (PRINIVIL,ZESTRIL) 5 MG tablet   rosuvastatin (CRESTOR) 5 MG tablet    Other Visit Diagnoses    Routine general medical examination at a health care facility    -  Primary    Relevant Orders    POC Hemoccult Bld/Stl (3-Cd Home Screen)    Hyperglycemia        Relevant Orders    POCT glycosylated hemoglobin (Hb A1C) (Completed)      Recommend restarting Crestor at low dose which has worked well in the past. Has tried multiple statins and fenofibrate. Discussed risk associated with long term uncontrolled cholesterol.  Meds ordered this encounter  Medications  . Red Yeast Rice Extract (RED YEAST RICE PO)    Sig: Take by mouth.  . Coenzyme Q10 (COQ10 PO)    Sig: Take by mouth.  Marland Kitchen lisinopril (PRINIVIL,ZESTRIL) 5 MG tablet    Sig: Take 1 tablet (5 mg total) by mouth daily.  Dispense:  30 tablet    Refill:  5    30 day only. Needs office visit    Order Specific Question:  Supervising Provider    Answer:  Mikey Kirschner [[id number]]  . pantoprazole (PROTONIX) 40 MG tablet    Sig: take 1 tablet by mouth twice a day before meals prn reflux    Dispense:  60 tablet    Refill:  5    Order Specific Question:  Supervising Provider    Answer:  Mikey Kirschner [[id number]]  . rosuvastatin (CRESTOR) 5 MG tablet    Sig: Take 1 tablet (5 mg  total) by mouth at bedtime.    Dispense:  30 tablet    Refill:  5    Order Specific Question:  Supervising Provider    Answer:  Mikey Kirschner [[id number]]   Return in about 6 months (around 11/11/2015) for recheck. Recommend lipid and liver profiles at that time.

## 2015-06-17 ENCOUNTER — Other Ambulatory Visit: Payer: Self-pay | Admitting: Family Medicine

## 2015-06-17 NOTE — Telephone Encounter (Signed)
Ok plus 2 ref 

## 2015-11-11 ENCOUNTER — Ambulatory Visit: Payer: BLUE CROSS/BLUE SHIELD | Admitting: Nurse Practitioner

## 2015-12-03 DIAGNOSIS — J0100 Acute maxillary sinusitis, unspecified: Secondary | ICD-10-CM | POA: Diagnosis not present

## 2015-12-03 DIAGNOSIS — Z23 Encounter for immunization: Secondary | ICD-10-CM | POA: Diagnosis not present

## 2015-12-03 DIAGNOSIS — H66001 Acute suppurative otitis media without spontaneous rupture of ear drum, right ear: Secondary | ICD-10-CM | POA: Diagnosis not present

## 2016-02-04 ENCOUNTER — Telehealth: Payer: Self-pay | Admitting: Family Medicine

## 2016-02-04 ENCOUNTER — Other Ambulatory Visit: Payer: Self-pay | Admitting: *Deleted

## 2016-02-04 MED ORDER — LISINOPRIL 5 MG PO TABS: 5.0000 mg | ORAL_TABLET | Freq: Every day | ORAL | 0 refills | Status: DC

## 2016-02-04 NOTE — Telephone Encounter (Signed)
Has appt this month for check up. 30 day supply sent to pharm. Pt notified.

## 2016-02-04 NOTE — Telephone Encounter (Signed)
Pt is needing a refill on her lisinopril (PRINIVIL,ZESTRIL) 5 MG tablet    [facility]

## 2016-02-19 ENCOUNTER — Ambulatory Visit (INDEPENDENT_AMBULATORY_CARE_PROVIDER_SITE_OTHER): Payer: BLUE CROSS/BLUE SHIELD | Admitting: Nurse Practitioner

## 2016-02-19 ENCOUNTER — Encounter: Payer: Self-pay | Admitting: Nurse Practitioner

## 2016-02-19 VITALS — BP 132/86 | Ht 64.0 in | Wt 173.0 lb

## 2016-02-19 DIAGNOSIS — E785 Hyperlipidemia, unspecified: Secondary | ICD-10-CM

## 2016-02-19 DIAGNOSIS — F419 Anxiety disorder, unspecified: Secondary | ICD-10-CM

## 2016-02-19 DIAGNOSIS — I10 Essential (primary) hypertension: Secondary | ICD-10-CM

## 2016-02-19 MED ORDER — ALPRAZOLAM 0.5 MG PO TABS: ORAL_TABLET | ORAL | 2 refills | Status: DC

## 2016-02-19 MED ORDER — LISINOPRIL 10 MG PO TABS: 10.0000 mg | ORAL_TABLET | Freq: Every day | ORAL | 1 refills | Status: DC

## 2016-02-19 NOTE — Patient Instructions (Signed)
Nitrous oxide

## 2016-02-19 NOTE — Progress Notes (Signed)
Subjective:  Presents for routine checkup on her blood pressure. Has increased her activity, now wearing a Fitbit and counting her steps. Healthy diet. Will have her lab work done through her employer in January. Recently stopped Crestor 5 mg due to extreme headaches which have resolved. No chest pain/ischemic type pain or shortness of breath. Has had extreme stress at her job. Also her husband had significant car accident several months ago which seemed to escalate her anxiety. Takes occasional Xanax only for extreme anxiety.   Objective:   BP 132/86   Ht 5\' 4"  (1.626 m)   Wt 173 lb (78.5 kg)   BMI 29.70 kg/m  NAD. Alert, oriented. Anxious affect. Thoughts logical coherent and relevant. Dressed appropriately. Taking good eye contact. Crying at times during office visit. Lungs clear. Heart regular rate rhythm. BP on recheck right arm sitting 172/118.    Assessment:  Problem List Items Addressed This Visit      Cardiovascular and Mediastinum   Hypertension - Primary   Relevant Medications   lisinopril (PRINIVIL,ZESTRIL) 10 MG tablet     Other   Anxiety   Relevant Medications   ALPRAZolam (XANAX) 0.5 MG tablet   Hyperlipidemia (Chronic)   Relevant Medications   lisinopril (PRINIVIL,ZESTRIL) 10 MG tablet        Plan:  Meds ordered this encounter  Medications  . ALPRAZolam (XANAX) 0.5 MG tablet    Sig: take 1 tablet by mouth twice a day if needed    Dispense:  30 tablet    Refill:  2    [facility] [place]    Order Specific Question:   Supervising Provider    Answer:   Mikey Kirschner [[id number]]  . lisinopril (PRINIVIL,ZESTRIL) 10 MG tablet    Sig: Take 1 tablet (10 mg total) by mouth daily.    Dispense:  90 tablet    Refill:  1    Order Specific Question:   Supervising Provider    Answer:   Mikey Kirschner [[id number]]   Increase lisinopril to 10 mg daily. Advised patient that it will most likely take more than this to get her blood pressure under decent control. Long discussion  about the role of stress and anxiety especially for her blood pressure. Patient defers daily medication as of today. To call back if she reconsiders. Also will need to review chart to find out what we may be able to use for cholesterol, patient has tried several medications. 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 Continue activity and weight loss efforts. Return in about 3 months (around 05/21/2016) for BP and cholesterol recheck, physical. Monitor BP outside the office. Bring lab work to her physical.

## 2016-03-10 ENCOUNTER — Telehealth: Payer: Self-pay | Admitting: Nurse Practitioner

## 2016-03-10 NOTE — Telephone Encounter (Addendum)
Faith Mahoney faxed over a letter from her Faith Mahoney Provider.  She would like for you to read over this and type up a letter for her stating the medical reason why she needs massages every 5-6 weeks.

## 2016-03-17 ENCOUNTER — Encounter: Payer: Self-pay | Admitting: Nurse Practitioner

## 2016-03-17 NOTE — Telephone Encounter (Signed)
Letter completed and given to [facility] to contact patient.

## 2016-03-18 ENCOUNTER — Telehealth: Payer: Self-pay | Admitting: Family Medicine

## 2016-03-18 NOTE — Telephone Encounter (Signed)
Message for Faith Mahoney) patient needing another letter for [facility] for massage and sinus therapy tablets as soon as you can please.

## 2016-03-31 ENCOUNTER — Encounter: Payer: Self-pay | Admitting: Nurse Practitioner

## 2016-03-31 NOTE — Telephone Encounter (Signed)
Letter is complete and will be given to front desk.

## 2016-04-13 ENCOUNTER — Encounter: Payer: Self-pay | Admitting: Family Medicine

## 2016-04-13 DIAGNOSIS — Z1231 Encounter for screening mammogram for malignant neoplasm of breast: Secondary | ICD-10-CM | POA: Diagnosis not present

## 2016-04-15 ENCOUNTER — Telehealth: Payer: Self-pay | Admitting: Family Medicine

## 2016-04-15 NOTE — Telephone Encounter (Signed)
Review mammogram screening faxed from [facility].

## 2016-04-18 NOTE — Telephone Encounter (Signed)
Mammogram negative-facility will be informing the patient

## 2016-05-20 ENCOUNTER — Encounter: Payer: Self-pay | Admitting: Nurse Practitioner

## 2016-05-20 ENCOUNTER — Ambulatory Visit (INDEPENDENT_AMBULATORY_CARE_PROVIDER_SITE_OTHER): Payer: BLUE CROSS/BLUE SHIELD | Admitting: Nurse Practitioner

## 2016-05-20 ENCOUNTER — Telehealth: Payer: Self-pay | Admitting: Nurse Practitioner

## 2016-05-20 VITALS — BP 122/88 | Ht 63.0 in | Wt 170.1 lb

## 2016-05-20 DIAGNOSIS — Z79899 Other long term (current) drug therapy: Secondary | ICD-10-CM

## 2016-05-20 DIAGNOSIS — Z0000 Encounter for general adult medical examination without abnormal findings: Secondary | ICD-10-CM

## 2016-05-20 DIAGNOSIS — E785 Hyperlipidemia, unspecified: Secondary | ICD-10-CM

## 2016-05-20 MED ORDER — TRIAMCINOLONE ACETONIDE 0.1 % EX CREA: 1.0000 "application " | TOPICAL_CREAM | Freq: Two times a day (BID) | CUTANEOUS | 0 refills | Status: DC

## 2016-05-20 MED ORDER — FENOFIBRATE 160 MG PO TABS: 160.0000 mg | ORAL_TABLET | Freq: Every day | ORAL | 2 refills | Status: DC

## 2016-05-20 MED ORDER — OMEGA-3-ACID ETHYL ESTERS 1 G PO CAPS: 2.0000 g | ORAL_CAPSULE | Freq: Two times a day (BID) | ORAL | 2 refills | Status: DC

## 2016-05-20 NOTE — Telephone Encounter (Signed)
Patient was just seen and noticed on AVS that prescriptions were being sent to the [facility] in [place] and she uses the one in [place].  I notified the nurse and they will change the RX's to go to [facility] in [place].  Patient notified to wait a little while before going to pick up meds.

## 2016-05-20 NOTE — Patient Instructions (Addendum)
Zaditor eye drops Start generic Lovaza for triglycerides; start one in the morning, then slowly work up to 2 twice a day if tolerated for total dose of 2 twice per day (if you can) Repeat labs in 3 months

## 2016-05-21 ENCOUNTER — Encounter: Payer: Self-pay | Admitting: Nurse Practitioner

## 2016-05-21 NOTE — Progress Notes (Signed)
Subjective:    Patient ID: Faith Mahoney, female    DOB: 08-02-1960, 56 y.o.   MRN: [id number]  HPI presents for her wellness exam. Same sexual partner. No pelvic pain. Regular vision and dental exams. Active lifestyle. Healthy diet. Continues to have significant stress.     Review of Systems  Constitutional: Negative for activity change, appetite change and fatigue.  HENT: Negative for dental problem, ear pain, sinus pressure and sore throat.   Respiratory: Negative for cough, chest tightness, shortness of breath and wheezing.   Cardiovascular: Negative for chest pain.  Gastrointestinal: Negative for abdominal distention, abdominal pain, blood in stool, constipation, diarrhea, nausea and vomiting.  Genitourinary: Negative for difficulty urinating, dysuria, enuresis, frequency, genital sores, pelvic pain, urgency and vaginal discharge.       Objective:   Physical Exam  Constitutional: She is oriented to person, place, and time. She appears well-developed. No distress.  HENT:  Right Ear: External ear normal.  Left Ear: External ear normal.  Mouth/Throat: Oropharynx is clear and moist.  Neck: Normal range of motion. Neck supple. No tracheal deviation present. No thyromegaly present.  Cardiovascular: Normal rate, regular rhythm and normal heart sounds.  Exam reveals no gallop.   No murmur heard. Pulmonary/Chest: Effort normal and breath sounds normal.  Abdominal: Soft. She exhibits no distension. There is no tenderness.  Genitourinary: Vagina normal. No vaginal discharge found.  Genitourinary Comments: External GU: no rashes or lesions. Vagina: no discharge. Bimanual exam: no tenderness or obvious masses.   Musculoskeletal: She exhibits no edema.  Lymphadenopathy:    She has no cervical adenopathy.  Neurological: She is alert and oriented to person, place, and time.  Skin: Skin is warm and dry. No rash noted.  Psychiatric: She has a normal mood and affect. Her behavior is  normal.  Vitals reviewed. Breast exam: minimal fine nodularity; no masses; axillae no adenopathy.  Has labs from work (see scanned report); TC 295; LDL 206; HDL 37 and TG 261 on 04/09/16.       Assessment & Plan:   Problem List Items Addressed This Visit      Other   Hyperlipidemia (Chronic)   Relevant Medications   omega-3 acid ethyl esters (LOVAZA) 1 g capsule   fenofibrate 160 MG tablet   Other Relevant Orders   Lipid panel    Other Visit Diagnoses    Routine general medical examination at a health care facility    -  Primary   High risk medication use       Relevant Orders   Hepatic function panel     Meds ordered this encounter  Medications  . DISCONTD: triamcinolone cream (KENALOG) 0.1 %    Sig: Apply 1 application topically 2 (two) times daily. Prn rash; use up to 2 weeks    Dispense:  30 g    Refill:  0    Order Specific Question:   Supervising Provider    Answer:   Mikey Kirschner [[id number]]  . DISCONTD: fenofibrate 160 MG tablet    Sig: Take 1 tablet (160 mg total) by mouth daily.    Dispense:  30 tablet    Refill:  2    Order Specific Question:   Supervising Provider    Answer:   Mikey Kirschner [[id number]]  . omega-3 acid ethyl esters (LOVAZA) 1 g capsule    Sig: Take 2 capsules (2 g total) by mouth 2 (two) times daily.    Dispense:  120 capsule  Refill:  2    Order Specific Question:   Supervising Provider    Answer:   Mikey Kirschner [[id number]]  . fenofibrate 160 MG tablet    Sig: Take 1 tablet (160 mg total) by mouth daily.    Dispense:  30 tablet    Refill:  2  . triamcinolone cream (KENALOG) 0.1 %    Sig: Apply 1 application topically 2 (two) times daily. Prn rash; use up to 2 weeks    Dispense:  30 g    Refill:  0   Restart Fenofibrate; unable to tolerate statins. Repeat lipid and liver profiles in 3 months; if not at goal, recommend referral to lipid clinic.  Start Lovaza to lower TG. Also recommend daily low dose ASA.  Return in about 6 months  (around 11/17/2016) for recheck.

## 2016-06-29 ENCOUNTER — Other Ambulatory Visit: Payer: Self-pay | Admitting: Nurse Practitioner

## 2016-07-12 DIAGNOSIS — J111 Influenza due to unidentified influenza virus with other respiratory manifestations: Secondary | ICD-10-CM | POA: Diagnosis not present

## 2016-07-12 DIAGNOSIS — J0100 Acute maxillary sinusitis, unspecified: Secondary | ICD-10-CM | POA: Diagnosis not present

## 2016-07-12 DIAGNOSIS — H66001 Acute suppurative otitis media without spontaneous rupture of ear drum, right ear: Secondary | ICD-10-CM | POA: Diagnosis not present

## 2016-07-12 DIAGNOSIS — R05 Cough: Secondary | ICD-10-CM | POA: Diagnosis not present

## 2016-11-17 ENCOUNTER — Encounter: Payer: Self-pay | Admitting: Nurse Practitioner

## 2016-11-17 ENCOUNTER — Ambulatory Visit (INDEPENDENT_AMBULATORY_CARE_PROVIDER_SITE_OTHER): Payer: BLUE CROSS/BLUE SHIELD | Admitting: Nurse Practitioner

## 2016-11-17 VITALS — BP 138/84 | Ht 63.0 in | Wt 170.6 lb

## 2016-11-17 DIAGNOSIS — E785 Hyperlipidemia, unspecified: Secondary | ICD-10-CM

## 2016-11-17 DIAGNOSIS — F419 Anxiety disorder, unspecified: Secondary | ICD-10-CM | POA: Diagnosis not present

## 2016-11-17 DIAGNOSIS — M2669 Other specified disorders of temporomandibular joint: Secondary | ICD-10-CM

## 2016-11-17 DIAGNOSIS — J329 Chronic sinusitis, unspecified: Secondary | ICD-10-CM

## 2016-11-17 DIAGNOSIS — I10 Essential (primary) hypertension: Secondary | ICD-10-CM

## 2016-11-17 DIAGNOSIS — K219 Gastro-esophageal reflux disease without esophagitis: Secondary | ICD-10-CM | POA: Diagnosis not present

## 2016-11-17 MED ORDER — AZITHROMYCIN 250 MG PO TABS: ORAL_TABLET | ORAL | 0 refills | Status: DC

## 2016-11-17 NOTE — Patient Instructions (Signed)
Nasacort AQ as directed 

## 2016-11-17 NOTE — Progress Notes (Signed)
Subjective:  Presents for routine follow up of HTN and cholesterol. BP doing much better with reduced stress at work. BP outside office 129-130/78-80s. Was doing power walking but has had to cut back due to a foot problem. Will make an appointment with podiatry. Takes one Omega 3 per day. Takes Protonix on a prn basis about every 3 days or so. Has tried to stop using it without success. C/o pain near the right jaw area radiating into the right ear. Mild head congestion. No fever, sore throat or cough. Slight yellow color to mucus.   Objective:   BP 138/84   Ht 5\' 3"  (1.6 m)   Wt 170 lb 9.6 oz (77.4 kg)   BMI 30.22 kg/m  NAD. Alert, oriented. TMs retracted, no erythema. Pharynx mildly injected with PND noted. Neck supple with mild anterior adenopathy. Distinct tenderness and tight muscles noted at right TMJ. Lungs clear. Heart RRR. Carotids no bruits or thrills. Abdomen soft, non distended, non tender. LE: no edema. Weight stable.   Assessment:   Problem List Items Addressed This Visit      Cardiovascular and Mediastinum   Hypertension - Primary   Relevant Orders   Hepatic function panel     Digestive   GERD (gastroesophageal reflux disease)     Other   Anxiety   Hyperlipidemia (Chronic)   Relevant Orders   Lipid panel    Other Visit Diagnoses    TMJ inflammation       Rhinosinusitis       Relevant Medications   azithromycin (ZITHROMAX Z-PAK) 250 MG tablet        Plan:   Meds ordered this encounter  Medications  . azithromycin (ZITHROMAX Z-PAK) 250 MG tablet    Sig: Take 2 tablets (500 mg) on  Day 1,  followed by 1 tablet (250 mg) once daily on Days 2 through 5.    Dispense:  6 each    Refill:  0    Order Specific Question:   Supervising Provider    Answer:   LUKING, WILLIAM S [[id number]]   Ice/heat applications and massage to right TMJ area. Encouraged podiatry evaluation and increased activity. Labs pending.  Add Nasacort to regimen. Call back if worsens or persists.   Return in about 6 months (around 05/20/2017) for physical. 25 minutes was spent with the patient. Greater than half the time was spent in discussion and answering questions and counseling regarding the issues that the patient came in for today.

## 2017-05-02 ENCOUNTER — Other Ambulatory Visit: Payer: Self-pay | Admitting: Nurse Practitioner

## 2017-05-25 ENCOUNTER — Encounter: Payer: BLUE CROSS/BLUE SHIELD | Admitting: Nurse Practitioner

## 2017-06-05 DIAGNOSIS — J029 Acute pharyngitis, unspecified: Secondary | ICD-10-CM | POA: Diagnosis not present

## 2017-06-05 DIAGNOSIS — H66001 Acute suppurative otitis media without spontaneous rupture of ear drum, right ear: Secondary | ICD-10-CM | POA: Diagnosis not present

## 2017-06-21 DIAGNOSIS — Z1231 Encounter for screening mammogram for malignant neoplasm of breast: Secondary | ICD-10-CM | POA: Diagnosis not present

## 2017-06-23 ENCOUNTER — Telehealth: Payer: Self-pay | Admitting: Family Medicine

## 2017-06-23 NOTE — Telephone Encounter (Signed)
Review screening mammogram results in results folder from [facility].

## 2017-06-24 ENCOUNTER — Encounter: Payer: Self-pay | Admitting: Nurse Practitioner

## 2017-06-24 ENCOUNTER — Ambulatory Visit (INDEPENDENT_AMBULATORY_CARE_PROVIDER_SITE_OTHER): Payer: BLUE CROSS/BLUE SHIELD | Admitting: Nurse Practitioner

## 2017-06-24 VITALS — BP 140/86 | Ht 63.0 in | Wt 172.2 lb

## 2017-06-24 DIAGNOSIS — Z01419 Encounter for gynecological examination (general) (routine) without abnormal findings: Secondary | ICD-10-CM

## 2017-06-24 DIAGNOSIS — F419 Anxiety disorder, unspecified: Secondary | ICD-10-CM

## 2017-06-24 DIAGNOSIS — Z0001 Encounter for general adult medical examination with abnormal findings: Secondary | ICD-10-CM

## 2017-06-24 DIAGNOSIS — E785 Hyperlipidemia, unspecified: Secondary | ICD-10-CM | POA: Diagnosis not present

## 2017-06-24 MED ORDER — SERTRALINE HCL 50 MG PO TABS: 50.0000 mg | ORAL_TABLET | Freq: Every day | ORAL | 2 refills | Status: DC

## 2017-06-24 MED ORDER — ALPRAZOLAM 0.5 MG PO TABS: ORAL_TABLET | ORAL | 2 refills | Status: DC

## 2017-06-24 NOTE — Progress Notes (Signed)
Subjective:    Patient ID: Faith Mahoney, female    DOB: 05/26/1960, 57 y.o.   MRN: [id number]  HPI Patient presents today for annual physical. She receives regular eye exams with an appt next week, regular dental exams every 6 months, and received a mammogram earlier this week. Patient received colonoscopy 4 years ago with instructions to return in 10 years. However, she reports increased stress at work that is affecting her sleep and daily functioning. She became tearful as she describes waking up 2-3x/week in the middle of the night with panicking, rapid respiratory rate, and shortness of breath. She reports using xanax during these episodes and gets relief. Additionally, she reports taking xanax at work when she is stressed or anxious, as well as, calming teas. She denies feelings of depression, self harm, or suicide.   Past Medical History:  Diagnosis Date  . Allergy   . Ankle fracture 02/2002  . Hyperlipidemia   . Hypertension   . Migraine   . PONV (postoperative nausea and vomiting)    Past Surgical History:  Procedure Laterality Date  . ABDOMINAL HYSTERECTOMY    . CHOLECYSTECTOMY    . COLONOSCOPY  10/14/2011   Procedure: COLONOSCOPY;  Surgeon: Rogene Houston, MD;  Location: [facility];  Service: Endoscopy;  Laterality: N/A;  9   Family History  Problem Relation Age of Onset  . Cancer Brother        bladder  . Cancer Maternal Grandmother 62       colon  . Diabetes Maternal Grandmother   . Heart disease Paternal Grandfather   . Hypertension Mother   . Hypertension Father   . Breast cancer Paternal Aunt   . Osteoporosis Paternal Grandmother   . Diabetes Paternal Grandmother   . Breast cancer Paternal Grandmother   . Breast cancer Paternal Aunt    Social History   Tobacco Use  . Smoking status: Never Smoker  . Smokeless tobacco: Never Used  Substance Use Topics  . Alcohol use: Not on file  . Drug use: No   -Living environment: Lives at home with  husband -Caffeine: 1-2 cups per day. Occasional green tea. Reduced coffee intake with increasing anxiety episodes.  -Exercise: Utilizes stand up desk at work, and walks numerous flights of stairs with occupation.    reports that she currently engages in sexual activity. She reports using the following method of birth control/protection: Surgical. Pt denies any new sexual partners. Husband is the only sexual partner.     Allergies  Allergen Reactions  . Sulfa Antibiotics Hives  . Peanuts [Peanut Oil] Itching and Rash    Can tolerate them every now and then  Seasonal allergies as well as dust.    Review of Systems -General: patient reports adequate energy level and stay active at work. However, she reports reduced walking due to bunions on left foot -HEENT: Patient denies vision changes, hearing changes, or nasal congestion. Pt denies sore throat, oral lesions, or difficulty swallowing.  -Cardiac: Denies chest pain, pressure, or palpitations.  -Pulmonary: Denies shortness of breath with the exception of anxiety attacks. Pt denies dyspnea or cough.  -GI: Patient reports regular soft bowel movements. Pt denies N/V/D or abd discomfort.  -GU: Denies frequency, urgency, or stress incontinence. Pt denies change in color or odor to urine.  -Breast: Pt denies tenderness, pain, or nodules. Pt denies nipple discharge, swelling, or tenderness -GYN: Patient dies itching or vaginal discomfort. Pt reports minimal vaginal discharge that is white, thin,  and non-malodorous -Musculoskeletal: Pt reports bunions to BLE. Pt reports L foot to be slightly painful with ambulation.  -Psych: Patient reports regular episodes of anxiety with night time awakenings. Patient denies depression symptoms, feelings of self harm, or suicidal ideation.  GAD 7 : Generalized Anxiety Score 06/24/2017  Nervous, Anxious, on Edge 3  Control/stop worrying 3  Worry too much - different things 3  Trouble relaxing 3  Restless 3   Easily annoyed or irritable 3  Afraid - awful might happen 3  Total GAD 7 Score 21  Anxiety Difficulty Somewhat difficult        Objective:   Physical Exam  General: WDWN. Appears calm until speaking about anxiety. Patient became tearful when describing anxiety symptoms.  HEENT: Ears - BIL external and cannal without erythema, exudate, or lesions. TM mild retraction without erythema or exudate. Bony landmarks visible. Throat - no lesions, erythema, or drainage. Thyroid- midline with no visible enlargement, nodules, masses on palpation.  Lymph: no evidence of adenopathy Cardiovascular: RRR. S1, S2 intact. No murmurs, gallops, or extra sounds.  Lungs: chest expansion equal. No use of accessory muscles. No adventitious breath sounds on auscultation.  Abdomen: Flat, soft, non-tender. No masses or tenderness on palpation  Breast: Symmetrical and smooth without nodules or masses. No erythema, swelling, or skin changes. Nipples symmetrical without discharge or deformity. Axillae no adenopathy.  GYN/GU: External- genitalia without erythema, lesions, swelling or masses. Urethra without erythema and midline. Clitoris without enlargement, lesions, or erythema. Vaginal mucosa pink without blood or discharge. Bimanual exam: No pelvic tenderness or masses on palpation. No lesions or hemorrhoids on external rectal examination.  Skin: Skin tags noted in BIL axillary folds and around around neck. No rash or open wounds. Redness to BIL bunions and callus on second metatarsal joint with contracture.   Vitals:   06/24/17 0919  BP: 140/86   Lipid Panel Total Cholesterol 276 Triglycerides 174 HDL 39 LDL 202     Assessment & Plan:   Problem List Items Addressed This Visit      Other   Hyperlipidemia (Chronic)   Relevant Orders   Ambulatory referral to Cardiology   Anxiety   Relevant Medications   sertraline (ZOLOFT) 50 MG tablet   ALPRAZolam (XANAX) 0.5 MG tablet    Other Visit Diagnoses     Well woman exam    -  Primary     Meds ordered this encounter  Medications  . sertraline (ZOLOFT) 50 MG tablet    Sig: Take 1 tablet (50 mg total) by mouth daily.    Dispense:  30 tablet    Refill:  2    Order Specific Question:   Supervising Provider    Answer:   Mikey Kirschner [[id number]]  . ALPRAZolam (XANAX) 0.5 MG tablet    Sig: take 1 tablet by mouth twice a day if needed    Dispense:  30 tablet    Refill:  2    Order Specific Question:   Supervising Provider    Answer:   Mikey Kirschner [[id number]]     Plan -Medications: refill xanax. Begin taking zoloft as prescribed. Potential adverse drug reactions reviewed.   -Referral: recommend referral to pediatry for bunions and foot pain. Recommend cardiology to discuss options for elevated LDL.  No improvement with fenofibrate. Has responded to statins in the past but complains of side effects and stops medications. Discussed importance of getting LDL under control to help prevent MI or stroke. She states today that  her son has been diagnosed with a similar problem with cholesterol.  -Follow up: in 1 month to reassess anxiety.

## 2017-06-25 ENCOUNTER — Encounter: Payer: Self-pay | Admitting: Nurse Practitioner

## 2017-06-28 ENCOUNTER — Telehealth: Payer: Self-pay | Admitting: Nurse Practitioner

## 2017-06-28 DIAGNOSIS — M79671 Pain in right foot: Secondary | ICD-10-CM | POA: Diagnosis not present

## 2017-06-28 DIAGNOSIS — M25579 Pain in unspecified ankle and joints of unspecified foot: Secondary | ICD-10-CM | POA: Diagnosis not present

## 2017-06-28 DIAGNOSIS — M79672 Pain in left foot: Secondary | ICD-10-CM | POA: Diagnosis not present

## 2017-06-28 DIAGNOSIS — M2022 Hallux rigidus, left foot: Secondary | ICD-10-CM | POA: Diagnosis not present

## 2017-06-28 NOTE — Telephone Encounter (Signed)
Pt is calling to check on the letter you were going to do for her anxiety & work changes & how it's affecting her health  Needs this soon, has a meeting with her boss's Thursday 06/30/17  Please advise

## 2017-06-29 ENCOUNTER — Encounter: Payer: Self-pay | Admitting: Nurse Practitioner

## 2017-06-29 NOTE — Telephone Encounter (Signed)
I have printed and signed letter. Please let her know. Thanks.

## 2017-07-01 NOTE — Telephone Encounter (Signed)
Has this been completed?

## 2017-07-07 NOTE — Telephone Encounter (Signed)
pleaseRequest mammogram be sent again-I am having a difficult time finding this

## 2017-07-12 DIAGNOSIS — M25579 Pain in unspecified ankle and joints of unspecified foot: Secondary | ICD-10-CM | POA: Diagnosis not present

## 2017-07-12 DIAGNOSIS — M79672 Pain in left foot: Secondary | ICD-10-CM | POA: Diagnosis not present

## 2017-07-13 ENCOUNTER — Other Ambulatory Visit: Payer: Self-pay | Admitting: Nurse Practitioner

## 2017-07-28 ENCOUNTER — Encounter: Payer: Self-pay | Admitting: Cardiology

## 2017-07-28 NOTE — Progress Notes (Signed)
Cardiology Office Note  Date: 08/01/2017   ID: JULINE SANDERFORD, DOB [birth year], MRN [id number]  PCP: Kathyrn Drown, MD  Consulting cardiologist: Rozann Lesches, MD   Chief Complaint  Patient presents with  . Hyperlipidemia    History of Present Illness: Faith Mahoney is a 57 y.o. female referred for cardiology consultation by Ms. Hoskins NP to discuss hyperlipidemia.  She has a long-standing history of hyperlipidemia, likely familial.  She states that her son also has very high cholesterol despite a good diet and being a marathon runner.  She has had leg pain and weakness associated with daily use of Crestor, Lipitor, and WelChol previously.  I reviewed her lab work from January which is outlined below.  At that time LDL was 202.  She has been on fenofibrate and omega-3 supplements.  She had to stop the omega-3 supplements however reporting side effects.  She does not have any history of ischemic heart disease or myocardial infarction.  I personally reviewed her ECG today which shows normal sinus rhythm.  She works at a [employer].  Past Medical History:  Diagnosis Date  . Allergy   . Ankle fracture 02/2002  . Hyperlipidemia   . Hypertension   . Migraine     Past Surgical History:  Procedure Laterality Date  . ABDOMINAL HYSTERECTOMY    . CHOLECYSTECTOMY    . COLONOSCOPY  10/14/2011   Procedure: COLONOSCOPY;  Surgeon: Rogene Houston, MD;  [facility];  Service: Endoscopy;  Laterality: N/A;  830    Current Outpatient Medications  Medication Sig Dispense Refill  . ALPRAZolam (XANAX) 0.5 MG tablet take 1 tablet by mouth twice a day if needed 30 tablet 2  . Calcium Carbonate-Vitamin D (CALCIUM + D PO) Take 1 tablet by mouth daily.    . clobetasol cream (TEMOVATE) 4.03 % Apply 1 application topically as needed.     . Coenzyme Q10 (COQ10 PO) Take by mouth.    . EPINEPHrine 0.3 mg/0.3 mL IJ SOAJ injection Inject 0.3 mLs (0.3 mg  total) into the muscle once. 1 Device 2  . fenofibrate 160 MG tablet TAKE 1 TABLET BY MOUTH EVERY DAY WITH FOOD 30 tablet 0  . glucosamine-chondroitin 500-400 MG tablet Take 1 tablet by mouth daily.    Marland Kitchen Ketotifen Fumarate (ALLERGY EYE DROPS OP) Apply 1-2 drops to eye as needed. For allergic eyes    . lisinopril (PRINIVIL,ZESTRIL) 10 MG tablet take 1 tablet by mouth once daily 90 tablet 1  . loratadine (CLARITIN) 10 MG tablet Take 10 mg by mouth daily as needed. For allergies    . Menthol, Topical Analgesic, (BIOFREEZE ROLL-ON) 4 % GEL Apply 1 application topically as needed. For pain    . Multiple Vitamin (MULTIVITAMIN WITH MINERALS) TABS Take 1 tablet by mouth daily.    . naproxen sodium (ANAPROX) 220 MG tablet Take 220 mg by mouth every 8 (eight) hours as needed. For pain    . Omega-3 Fatty Acids (OMEGA 3 PO) Take by mouth.    . pantoprazole (PROTONIX) 40 MG tablet take 1 tablet by mouth twice a day before meals if needed for REFLUX 60 tablet 5  . sertraline (ZOLOFT) 50 MG tablet Take 1 tablet (50 mg total) by mouth daily. 30 tablet 2  . triamcinolone cream (KENALOG) 0.1 % Apply 1 application topically 2 (two) times daily. Prn rash; use up to 2 weeks 30 g 0  . vitamin C (ASCORBIC ACID)  500 MG tablet Take 500 mg by mouth daily.    Marland Kitchen ezetimibe (ZETIA) 10 MG tablet Take 1 tablet (10 mg total) by mouth daily. 90 tablet 3  . rosuvastatin (CRESTOR) 5 MG tablet Take 1 tablet 5 mg every Monday,Wednesday, Friday 36 tablet 3   No current facility-administered medications for this visit.    Allergies:  Sulfa antibiotics and Peanuts [peanut oil]   Social History: The patient  reports that she has never smoked. She has never used smokeless tobacco. She reports that she does not drink alcohol or use drugs.   Family History: The patient's family history includes Bladder Cancer in her brother; Breast cancer in her paternal aunt, paternal aunt, and paternal grandmother; Colon cancer (age of onset: 67) in  her maternal grandmother; Diabetes in her maternal grandmother and paternal grandmother; Heart disease in her paternal grandfather; Hyperlipidemia in her son; Hypertension in her father and mother; Osteoporosis in her paternal grandmother.   ROS:  Please see the history of present illness. Otherwise, complete review of systems is positive for situational anxiety with recent job changes.  All other systems are reviewed and negative.   Physical Exam: VS:  BP 120/76 (BP Location: Left Arm)   Pulse 78   Ht 5\' 3"  (1.6 m)   Wt 172 lb (78 kg)   SpO2 98%   BMI 30.47 kg/m , BMI Body mass index is 30.47 kg/m.  Wt Readings from Last 3 Encounters:  08/01/17 172 lb (78 kg)  06/24/17 172 lb 3.2 oz (78.1 kg)  11/17/16 170 lb 9.6 oz (77.4 kg)    General: Patient appears comfortable at rest. HEENT: Conjunctiva and lids normal, oropharynx clear. Neck: Supple, no elevated JVP or carotid bruits, no thyromegaly. Lungs: Clear to auscultation, nonlabored breathing at rest. Cardiac: Regular rate and rhythm, no S3 or significant systolic murmur, no pericardial rub. Abdomen: Soft, nontender, bowel sounds present. Extremities: No pitting edema, distal pulses 2+. Skin: Warm and dry. Musculoskeletal: No kyphosis. Neuropsychiatric: Alert and oriented x3, affect grossly appropriate.  ECG: No old tracing available for comparison.  Recent Labwork:  January 2019: BUN 14, creatinine 0.69, potassium 4.2, AST 24, ALT 25, cholesterol 276, triglycerides 174, HDL 39, LDL 202, TSH 1.25, hemoglobin 12.6, platelets 325  Assessment and Plan:  1.  Mixed hyperlipidemia, likely familial.  She has had statin intolerance as detailed above.  At this point our plan is to have her continue on fenofibrate.  We will start Zetia 10 mg daily and try her on Crestor 5 mg Monday, Wednesday, and Friday.  If she is able to tolerate this regimen we will recheck FLP and LFTs in 6 weeks.  Otherwise can make further adjustments.  Repatha  would obviously be a consideration, although I am not certain that this would be covered by her insurance.  2.  Essential hypertension, currently on lisinopril.  Blood pressure is well controlled today.  Keep follow-up with PCP.  Current medicines were reviewed with the patient today.   Orders Placed This Encounter  Procedures  . Lipid Profile  . Hepatic function panel  . EKG 12-Lead    Disposition: Follow-up in 6 weeks.  Signed, Satira Sark, MD, [facility] 08/01/2017 9:21 AM    [facility]. [address], [place], [place] [address] Phone: [phone]; Fax: [phone]

## 2017-08-01 ENCOUNTER — Encounter

## 2017-08-01 ENCOUNTER — Ambulatory Visit: Payer: BLUE CROSS/BLUE SHIELD | Admitting: Cardiology

## 2017-08-01 ENCOUNTER — Encounter: Payer: Self-pay | Admitting: Cardiology

## 2017-08-01 VITALS — BP 120/76 | HR 78 | Ht 63.0 in | Wt 172.0 lb

## 2017-08-01 DIAGNOSIS — E7849 Other hyperlipidemia: Secondary | ICD-10-CM | POA: Diagnosis not present

## 2017-08-01 DIAGNOSIS — I10 Essential (primary) hypertension: Secondary | ICD-10-CM

## 2017-08-01 MED ORDER — EZETIMIBE 10 MG PO TABS: 10.0000 mg | ORAL_TABLET | Freq: Every day | ORAL | 3 refills | Status: DC

## 2017-08-01 MED ORDER — ROSUVASTATIN CALCIUM 5 MG PO TABS: ORAL_TABLET | ORAL | 3 refills | Status: DC

## 2017-08-01 NOTE — Patient Instructions (Addendum)
Your physician wants you to follow-up in:6 weeks  with Dr.McDowell      Stop Fish Oil   START Zetia 10 mg daily   Take Crestor 5 mg, every Monday,Wednesday, Friday    Re-check Fasting lab work in 6 weeks : Lipids,LFT's   No tests ordered today     Thank you for choosing [facility] !

## 2017-09-12 NOTE — Progress Notes (Signed)
Cardiology Office Note  Date: 09/13/2017   ID: Faith Mahoney, DOB Jul 10, 1960, MRN [id number]  PCP: Kathyrn Drown, MD  Primary Cardiologist: Rozann Lesches, MD   Chief Complaint  Patient presents with  . Hyperlipidemia    History of Present Illness: Faith Mahoney is a 57 y.o. female last seen in April.  She presents for a routine visit. At the last visit we initiated Zetia along with low-dose Crestor.  So far she has tolerated this without progressive fatigue or leg pain.  She reports no other obvious side effects.  She did have follow-up lab work obtained this morning, results are currently pending.  Past Medical History:  Diagnosis Date  . Allergy   . Ankle fracture 02/2002  . Hyperlipidemia   . Hypertension   . Migraine     Past Surgical History:  Procedure Laterality Date  . ABDOMINAL HYSTERECTOMY    . CHOLECYSTECTOMY    . COLONOSCOPY  10/14/2011   Procedure: COLONOSCOPY;  Surgeon: Rogene Houston, MD;  [facility];  Service: Endoscopy;  Laterality: N/A;  830    Current Outpatient Medications  Medication Sig Dispense Refill  . ALPRAZolam (XANAX) 0.5 MG tablet take 1 tablet by mouth twice a day if needed 30 tablet 2  . Calcium Carbonate-Vitamin D (CALCIUM + D PO) Take 1 tablet by mouth daily.    . clobetasol cream (TEMOVATE) 5.99 % Apply 1 application topically as needed.     . Coenzyme Q10 (COQ10 PO) Take by mouth.    . EPINEPHrine 0.3 mg/0.3 mL IJ SOAJ injection Inject 0.3 mLs (0.3 mg total) into the muscle once. 1 Device 2  . ezetimibe (ZETIA) 10 MG tablet Take 1 tablet (10 mg total) by mouth daily. 90 tablet 3  . fenofibrate 160 MG tablet TAKE 1 TABLET BY MOUTH EVERY DAY WITH FOOD 30 tablet 0  . glucosamine-chondroitin 500-400 MG tablet Take 1 tablet by mouth daily.    Marland Kitchen Ketotifen Fumarate (ALLERGY EYE DROPS OP) Apply 1-2 drops to eye as needed. For allergic eyes    . lisinopril (PRINIVIL,ZESTRIL) 10 MG tablet take 1 tablet by mouth once  daily 90 tablet 1  . loratadine (CLARITIN) 10 MG tablet Take 10 mg by mouth daily as needed. For allergies    . Menthol, Topical Analgesic, (BIOFREEZE ROLL-ON) 4 % GEL Apply 1 application topically as needed. For pain    . Multiple Vitamin (MULTIVITAMIN WITH MINERALS) TABS Take 1 tablet by mouth daily.    . naproxen sodium (ANAPROX) 220 MG tablet Take 220 mg by mouth every 8 (eight) hours as needed. For pain    . Omega-3 Fatty Acids (OMEGA 3 PO) Take by mouth.    . pantoprazole (PROTONIX) 40 MG tablet take 1 tablet by mouth twice a day before meals if needed for REFLUX 60 tablet 5  . rosuvastatin (CRESTOR) 5 MG tablet Take 1 tablet 5 mg every Monday,Wednesday, Friday 36 tablet 3  . sertraline (ZOLOFT) 50 MG tablet Take 1 tablet (50 mg total) by mouth daily. 30 tablet 2  . triamcinolone cream (KENALOG) 0.1 % Apply 1 application topically 2 (two) times daily. Prn rash; use up to 2 weeks 30 g 0  . vitamin C (ASCORBIC ACID) 500 MG tablet Take 500 mg by mouth daily.     No current facility-administered medications for this visit.    Allergies:  Sulfa antibiotics and Peanuts [peanut oil]   Social History: The patient  reports that she  has never smoked. She has never used smokeless tobacco. She reports that she does not drink alcohol or use drugs.   ROS:  Please see the history of present illness. Otherwise, complete review of systems is positive for none.  All other systems are reviewed and negative.   Physical Exam: VS:  BP 122/80   Pulse 68   Ht 5\' 3"  (1.6 m)   Wt 168 lb (76.2 kg)   SpO2 98%   BMI 29.76 kg/m , BMI Body mass index is 29.76 kg/m.  Wt Readings from Last 3 Encounters:  09/13/17 168 lb (76.2 kg)  08/01/17 172 lb (78 kg)  06/24/17 172 lb 3.2 oz (78.1 kg)    General: Patient appears comfortable at rest. HEENT: Conjunctiva and lids normal, oropharynx clear. Neck: Supple, no elevated JVP or carotid bruits, no thyromegaly. Lungs: Clear to auscultation, nonlabored breathing at  rest. Cardiac: Regular rate and rhythm, no S3 or significant systolic murmur. Abdomen: Soft, nontender, bowel sounds present. Extremities: No pitting edema, distal pulses 2+.  ECG: I personally reviewed the tracing from 08/01/2017 which showed normal sinus rhythm.  Recent Labwork:  January 2019: BUN 14, creatinine 0.69, potassium 4.2, AST 24, ALT 25, cholesterol 276, triglycerides 174, HDL 39, LDL 202, TSH 1.25, hemoglobin 12.6, platelets 325  Assessment and Plan:  1.  Familial hyperlipidemia.  At this point she is tolerating Zetia 10 mg daily and Crestor 5 mg on Monday, Wednesday, and Friday.  I plan to follow-up on the results of her FLP and LFT drawn this morning.  If LDL has come down significantly we might continue to increase her Crestor further.  Otherwise if there has not been a substantial change, we will go ahead and switch to Repatha.  2.  Essential hypertension, on lisinopril.  Continues to follow with Dr. Wolfgang Phoenix.  Current medicines were reviewed with the patient today.  Disposition: Review lab work and determine medication changes with follow-up.  Signed, Satira Sark, MD, [facility] 09/13/2017 9:22 AM    [facility] [address] [place], [place] [address] Phone: [phone]; Fax: [phone]

## 2017-09-13 ENCOUNTER — Telehealth: Payer: Self-pay

## 2017-09-13 ENCOUNTER — Ambulatory Visit: Payer: BLUE CROSS/BLUE SHIELD | Admitting: Cardiology

## 2017-09-13 ENCOUNTER — Other Ambulatory Visit (HOSPITAL_COMMUNITY)
Admission: RE | Admit: 2017-09-13 | Discharge: 2017-09-13 | Disposition: A | Discharge status: Home / Self Care | Payer: BLUE CROSS/BLUE SHIELD | Source: Ambulatory Visit | Attending: Cardiology | Admitting: Cardiology

## 2017-09-13 ENCOUNTER — Encounter: Payer: Self-pay | Admitting: Cardiology

## 2017-09-13 VITALS — BP 122/80 | HR 68 | Ht 63.0 in | Wt 168.0 lb

## 2017-09-13 DIAGNOSIS — I10 Essential (primary) hypertension: Secondary | ICD-10-CM | POA: Diagnosis not present

## 2017-09-13 DIAGNOSIS — E7849 Other hyperlipidemia: Secondary | ICD-10-CM | POA: Insufficient documentation

## 2017-09-13 LAB — HEPATIC FUNCTION PANEL
ALK PHOS: 51 U/L (ref 38–126)
ALT: 29 U/L (ref 14–54)
AST: 23 U/L (ref 15–41)
Albumin: 4.4 g/dL (ref 3.5–5.0)
Bilirubin, Direct: 0.1 mg/dL (ref 0.1–0.5)
Indirect Bilirubin: 0.8 mg/dL (ref 0.3–0.9)
TOTAL PROTEIN: 7.7 g/dL (ref 6.5–8.1)
Total Bilirubin: 0.9 mg/dL (ref 0.3–1.2)

## 2017-09-13 LAB — LIPID PANEL
CHOL/HDL RATIO: 4.1 ratio
Cholesterol: 167 mg/dL (ref 0–200)
HDL: 41 mg/dL (ref >40)
LDL Cholesterol: 101 mg/dL — ABNORMAL HIGH (ref 0–99)
Triglycerides: 123 mg/dL (ref <150)
VLDL: 25 mg/dL (ref 0–40)

## 2017-09-13 MED ORDER — ROSUVASTATIN CALCIUM 10 MG PO TABS: ORAL_TABLET | ORAL | 6 refills | Status: DC

## 2017-09-13 NOTE — Telephone Encounter (Signed)
-----   Message from Satira Sark, MD sent at 09/13/2017 10:29 AM EDT ----- Results reviewed.  Actually, she has had a very nice response to therapy.  LDL has come down from 202 to 101.  LFTs are normal.  Let's try and increase Crestor to 10 mg on Monday, Wednesday, and Friday. A copy of this test should be forwarded to Kathyrn Drown, MD.

## 2017-09-13 NOTE — Patient Instructions (Addendum)
Your physician wants you to follow-up in: to be determined after lab work is back    I will call you with results and any medication changes    No tests ordered today      Thank you for choosing [facility] !

## 2017-09-13 NOTE — Telephone Encounter (Signed)
LM on pts cell with new crestor dosing,e-scribed new rx

## 2017-09-20 DIAGNOSIS — M79672 Pain in left foot: Secondary | ICD-10-CM | POA: Diagnosis not present

## 2017-09-20 DIAGNOSIS — M25579 Pain in unspecified ankle and joints of unspecified foot: Secondary | ICD-10-CM | POA: Diagnosis not present

## 2017-11-09 ENCOUNTER — Other Ambulatory Visit: Payer: Self-pay | Admitting: Nurse Practitioner

## 2018-02-06 ENCOUNTER — Telehealth: Payer: Self-pay | Admitting: Family Medicine

## 2018-02-06 MED ORDER — SCOPOLAMINE 1 MG/3DAYS TD PT72: MEDICATED_PATCH | TRANSDERMAL | 0 refills | Status: DC

## 2018-02-06 MED ORDER — PANTOPRAZOLE SODIUM 40 MG PO TBEC: DELAYED_RELEASE_TABLET | ORAL | 4 refills | Status: DC

## 2018-02-06 NOTE — Telephone Encounter (Signed)
Patient is requesting prescription for motion sickness patch and a refill on protonix 40 mg [facility]

## 2018-02-06 NOTE — Telephone Encounter (Signed)
Patient notified and verbalized understanding. 

## 2018-02-06 NOTE — Telephone Encounter (Signed)
Please advise 

## 2018-02-06 NOTE — Telephone Encounter (Signed)
May have 5 months refill on PPI Scopolamine patch, one box, may apply every 3 days as needed for motion sickness, the box contains 3 patches, motion sickness patches can cause dry mouth and blurry vision such as difficulty reading small print if dry mouth or blurry vision is severe then stop the medicine

## 2018-02-06 NOTE — Telephone Encounter (Signed)
Sent in to the requested pharmacy. I called left a vm asked that she r/c.

## 2018-03-08 ENCOUNTER — Other Ambulatory Visit: Payer: Self-pay

## 2018-03-08 NOTE — Telephone Encounter (Signed)
May have this +3refills Will need follow-up office visit I [facility]  Please inform the patient that it is not recommended to be on anti-inflammatories with these type of antidepressants because of increased risk of GI bleeds

## 2018-03-09 MED ORDER — SERTRALINE HCL 50 MG PO TABS: 50.0000 mg | ORAL_TABLET | Freq: Every day | ORAL | 3 refills | Status: DC

## 2018-03-23 DIAGNOSIS — J029 Acute pharyngitis, unspecified: Secondary | ICD-10-CM | POA: Diagnosis not present

## 2018-04-13 ENCOUNTER — Encounter: Payer: Self-pay | Admitting: Family Medicine

## 2018-04-13 ENCOUNTER — Ambulatory Visit (INDEPENDENT_AMBULATORY_CARE_PROVIDER_SITE_OTHER): Payer: BLUE CROSS/BLUE SHIELD | Admitting: Family Medicine

## 2018-04-13 VITALS — BP 138/100 | Temp 98.4°F | Ht 63.0 in | Wt 171.0 lb

## 2018-04-13 DIAGNOSIS — J029 Acute pharyngitis, unspecified: Secondary | ICD-10-CM

## 2018-04-13 DIAGNOSIS — J019 Acute sinusitis, unspecified: Secondary | ICD-10-CM | POA: Diagnosis not present

## 2018-04-13 DIAGNOSIS — J020 Streptococcal pharyngitis: Secondary | ICD-10-CM | POA: Diagnosis not present

## 2018-04-13 DIAGNOSIS — B9689 Other specified bacterial agents as the cause of diseases classified elsewhere: Secondary | ICD-10-CM | POA: Diagnosis not present

## 2018-04-13 LAB — POCT RAPID STREP A (OFFICE): Rapid Strep A Screen: POSITIVE — AB

## 2018-04-13 MED ORDER — CEFTRIAXONE SODIUM 500 MG IJ SOLR
500.0000 mg | Freq: Once | INTRAMUSCULAR | Status: AC
  Administered 2018-04-13: 500 mg via INTRAMUSCULAR

## 2018-04-13 MED ORDER — LEVOFLOXACIN 500 MG PO TABS: 500.0000 mg | ORAL_TABLET | Freq: Every day | ORAL | 0 refills | Status: DC

## 2018-04-13 NOTE — Progress Notes (Signed)
   Subjective:    Patient ID: Faith Mahoney, female    DOB: 1960-06-23, 58 y.o.   MRN: [id number]  HPI  Patient is here today with complaints of extreme sore throat,trouble swallowing,presistane cough,congestion,bialteral ear pain,runny nose,headache,fever. [facility] [place] and was given an antibiotic and told that she had an upper respiratory infection and right ear infection. She has been taking day and nite Quil and Tylenol.  Review of Systems  Constitutional: Negative for activity change and fever.  HENT: Positive for congestion and rhinorrhea. Negative for ear pain.   Eyes: Negative for discharge.  Respiratory: Positive for cough. Negative for shortness of breath and wheezing.   Cardiovascular: Negative for chest pain.       Objective:   Physical Exam Vitals signs and nursing note reviewed.  Constitutional:      Appearance: She is well-developed.  HENT:     Head: Normocephalic.     Nose: Nose normal.     Mouth/Throat:     Pharynx: No oropharyngeal exudate.  Neck:     Musculoskeletal: Neck supple.  Cardiovascular:     Rate and Rhythm: Normal rate.     Heart sounds: Normal heart sounds. No murmur.  Pulmonary:     Effort: Pulmonary effort is normal.     Breath sounds: Normal breath sounds. No wheezing.  Lymphadenopathy:     Cervical: No cervical adenopathy.  Skin:    General: Skin is warm and dry.     Results for orders placed or performed in visit on 04/13/18  POCT rapid strep A  Result Value Ref Range   Rapid Strep A Screen Positive (A) Negative         Assessment & Plan:  Strep throat Antibiotic prescribed warning signs discussed follow-up if progressive troubles Secondary rhinosinusitis Levaquin 10 days Rocephin 500 mg shot for the strep throat Patient is to follow-up if progressive troubles

## 2018-06-22 ENCOUNTER — Other Ambulatory Visit: Payer: Self-pay | Admitting: *Deleted

## 2018-06-22 MED ORDER — LISINOPRIL 10 MG PO TABS: 10.0000 mg | ORAL_TABLET | Freq: Every day | ORAL | 0 refills | Status: DC

## 2018-06-30 ENCOUNTER — Telehealth: Payer: Self-pay | Admitting: Family Medicine

## 2018-06-30 MED ORDER — ALPRAZOLAM 0.5 MG PO TABS: ORAL_TABLET | ORAL | 2 refills | Status: DC

## 2018-06-30 NOTE — Telephone Encounter (Signed)
Fax from pharmacy requesting refill on Alprazolam 0.5 mg tablets. Take one tablet po BID prn. Pt last seen 04/13/2018 for sore throat

## 2018-06-30 NOTE — Telephone Encounter (Signed)
Ok plus two ref 

## 2018-06-30 NOTE — Telephone Encounter (Signed)
Prescription faxed to pharmacy.

## 2018-06-30 NOTE — Addendum Note (Signed)
Addended by: Dairl Ponder on: 06/30/2018 03:54 PM   Modules accepted: Orders

## 2018-09-04 ENCOUNTER — Other Ambulatory Visit: Payer: Self-pay | Admitting: Cardiology

## 2018-12-19 ENCOUNTER — Other Ambulatory Visit: Payer: Self-pay | Admitting: Family Medicine

## 2018-12-21 ENCOUNTER — Other Ambulatory Visit: Payer: Self-pay | Admitting: *Deleted

## 2018-12-21 ENCOUNTER — Telehealth: Payer: Self-pay | Admitting: Family Medicine

## 2018-12-21 MED ORDER — CETIRIZINE HCL 10 MG PO TABS: 10.0000 mg | ORAL_TABLET | Freq: Every day | ORAL | 3 refills | Status: DC

## 2018-12-21 NOTE — Telephone Encounter (Signed)
Please schedule and then send back to nurses. thanks

## 2018-12-21 NOTE — Telephone Encounter (Signed)
Pt is scheduled for phone visit 01/02/2019 - please refill med & call pt when done

## 2018-12-21 NOTE — Telephone Encounter (Signed)
Has appt on 9/29

## 2018-12-21 NOTE — Telephone Encounter (Signed)
Zyrtec 10 mg one po qd 90 three ref

## 2018-12-21 NOTE — Telephone Encounter (Signed)
Pt requesting a prescription for generic Zyrtec be sent to [facility] - pt states with the prescription she can get it for $4.00 (OTC is expensive)  Please advise & call pt when done

## 2018-12-21 NOTE — Telephone Encounter (Signed)
Need to schedule visit either in person or virtual May have 30-day

## 2018-12-21 NOTE — Telephone Encounter (Signed)
LVM to schedule appointment.

## 2018-12-21 NOTE — Telephone Encounter (Signed)
Left message to return to let her know zyrtec and fenofibrate was sent in to pharm. Fenofibrate was in rx request with a note to let pt know after sending in med.

## 2018-12-27 NOTE — Telephone Encounter (Signed)
Left message to return call. Contact [facility] to see if patient had picked up med but she has not

## 2018-12-29 NOTE — Telephone Encounter (Signed)
Patient notified

## 2019-01-02 ENCOUNTER — Ambulatory Visit (INDEPENDENT_AMBULATORY_CARE_PROVIDER_SITE_OTHER): Payer: BC Managed Care – PPO | Admitting: Family Medicine

## 2019-01-02 ENCOUNTER — Other Ambulatory Visit: Payer: Self-pay

## 2019-01-02 DIAGNOSIS — E7849 Other hyperlipidemia: Secondary | ICD-10-CM | POA: Diagnosis not present

## 2019-01-02 DIAGNOSIS — I10 Essential (primary) hypertension: Secondary | ICD-10-CM

## 2019-01-02 DIAGNOSIS — Z1159 Encounter for screening for other viral diseases: Secondary | ICD-10-CM

## 2019-01-02 DIAGNOSIS — Z79899 Other long term (current) drug therapy: Secondary | ICD-10-CM

## 2019-01-02 DIAGNOSIS — F419 Anxiety disorder, unspecified: Secondary | ICD-10-CM

## 2019-01-02 MED ORDER — LISINOPRIL 10 MG PO TABS: 10.0000 mg | ORAL_TABLET | Freq: Every day | ORAL | 1 refills | Status: DC

## 2019-01-02 MED ORDER — CLOBETASOL PROPIONATE 0.05 % EX CREA: 1.0000 "application " | TOPICAL_CREAM | CUTANEOUS | 2 refills | Status: DC | PRN

## 2019-01-02 MED ORDER — ALPRAZOLAM 0.5 MG PO TABS: ORAL_TABLET | ORAL | 4 refills | Status: DC

## 2019-01-02 MED ORDER — FAMOTIDINE 40 MG PO TABS: 40.0000 mg | ORAL_TABLET | Freq: Every day | ORAL | 5 refills | Status: DC

## 2019-01-02 MED ORDER — FENOFIBRATE 160 MG PO TABS: 160.0000 mg | ORAL_TABLET | Freq: Every day | ORAL | 1 refills | Status: DC

## 2019-01-02 MED ORDER — EPINEPHRINE 0.3 MG/0.3ML IJ SOAJ: 0.3000 mg | Freq: Once | INTRAMUSCULAR | 2 refills | Status: AC

## 2019-01-02 MED ORDER — SHINGRIX 50 MCG/0.5ML IM SUSR: 0.5000 mL | Freq: Once | INTRAMUSCULAR | 1 refills | Status: AC

## 2019-01-02 MED ORDER — ROSUVASTATIN CALCIUM 10 MG PO TABS: ORAL_TABLET | ORAL | 6 refills | Status: DC

## 2019-01-02 NOTE — Progress Notes (Signed)
Subjective:    Patient ID: Faith Mahoney, female    DOB: February 16, 1961, 58 y.o.   MRN: [id number]  HPI Pt here today for med check. Pt is needing refills on bp med (Lisinopril) and Crestor. Pt states she is doing well. Takes Alprazolam prn. No issues or concerns. Patient does take cholesterol medicines regular basis denies any problem with that states muscles feel fine no joint issues.  Watches diet fairly closely takes her blood pressure medicine watches salt diet stays active states blood pressure under good control patient relates Xanax on a rare basis does not use it frequently Relates to reflux under good control Virtual Visit via Video Note  I connected with Faith Mahoney on 01/02/19 at  8:30 AM EDT by a video enabled telemedicine application and verified that I am speaking with the correct person using two identifiers.  Location: Patient: home Provider: office   I discussed the limitations of evaluation and management by telemedicine and the availability of in person appointments. The patient expressed understanding and agreed to proceed.  History of Present Illness:    Observations/Objective:   Assessment and Plan:   Follow Up Instructions:    I discussed the assessment and treatment plan with the patient. The patient was provided an opportunity to ask questions and all were answered. The patient agreed with the plan and demonstrated an understanding of the instructions.   The patient was advised to call back or seek an in-person evaluation if the symptoms worsen or if the condition fails to improve as anticipated.  I provided 25 minutes of non-face-to-face time during this encounter.   Vicente Males, LPN Patient denies being depressed states she uses Xanax on an intermittent basis states it helps control anxiousness when it occurs  Patient does try to take her cholesterol medicine regular basis tries watch her diet tries to stay physically active.  She states  her blood pressure is been under good control she denies any major setbacks there.  Patient does relate how she gets poison ivy and she would like to have more clobetasol cream to use for that when necessary  She also relates reflux related issues she understands PPIs can cause problems she is willing to try famotidine states reflux been under good control Review of Systems  Constitutional: Negative for activity change, appetite change and fatigue.  HENT: Negative for congestion and rhinorrhea.   Respiratory: Negative for cough and shortness of breath.   Cardiovascular: Negative for chest pain and leg swelling.  Gastrointestinal: Negative for abdominal pain and diarrhea.  Endocrine: Negative for polydipsia and polyphagia.  Skin: Negative for color change.  Neurological: Negative for dizziness and weakness.  Psychiatric/Behavioral: Negative for behavioral problems and confusion.       Objective:   Physical Exam   Today's visit was via telephone Physical exam was not possible for this visit      Assessment & Plan:  1. Essential hypertension Patient states when she checks her blood pressure is been doing well - Lipid Profile - Hepatic function panel - Basic Metabolic Panel (BMET)  2. Other hyperlipidemia She does try to watch her diet she takes her medicine she is willing to do lab work previous labs reviewed - Lipid Profile - Hepatic function panel - Basic Metabolic Panel (BMET)  3. Encounter for hepatitis C screening test for low risk patient Hep C antibody per CDC guideline - Hepatitis C Antibody  4. High risk medication use Check liver functions because the medication  she is on - Lipid Profile - Hepatic function panel - Basic Metabolic Panel (BMET)  Steroid cream for intermittent use for poison ivy  Famotidine for reflux related issues to try to avoid PPI

## 2019-01-23 ENCOUNTER — Other Ambulatory Visit: Payer: Self-pay | Admitting: Family Medicine

## 2019-04-16 ENCOUNTER — Ambulatory Visit (INDEPENDENT_AMBULATORY_CARE_PROVIDER_SITE_OTHER): Payer: BC Managed Care – PPO | Admitting: Family Medicine

## 2019-04-16 DIAGNOSIS — J019 Acute sinusitis, unspecified: Secondary | ICD-10-CM

## 2019-04-16 MED ORDER — AMOXICILLIN-POT CLAVULANATE 875-125 MG PO TABS: 1.0000 | ORAL_TABLET | Freq: Two times a day (BID) | ORAL | 0 refills | Status: DC

## 2019-04-16 NOTE — Progress Notes (Signed)
   Subjective:    Patient ID: Faith Mahoney, female    DOB: 15-Dec-1960, 59 y.o.   MRN: [id number]  Cough This is a new problem. Episode onset: ear pain- Thursday morn. Associated symptoms include rhinorrhea. Pertinent negatives include no chest pain, ear pain, fever, shortness of breath or wheezing. Associated symptoms comments: Left ear pain, sinus pressure (tender to touch), sinus headache. Treatments tried: humidifier, sinus cold med, saline solution, afrin  The treatment provided no relief.  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  Location: Patient: home Provider: office   I discussed the limitations, risks, security and privacy concerns of performing an evaluation and management service by telephone and the availability of in person appointments. I also discussed with the patient that there may be a patient responsible charge related to this service. The patient expressed understanding and agreed to proceed.   History of Present Illness:    Observations/Objective:   Assessment and Plan:   Follow Up Instructions:    I discussed the assessment and treatment plan with the patient. The patient was provided an opportunity to ask questions and all were answered. The patient agreed with the plan and demonstrated an understanding of the instructions.   The patient was advised to call back or seek an in-person evaluation if the symptoms worsen or if the condition fails to improve as anticipated.  I provided 15 minutes of non-face-to-face time during this encounter.       Review of Systems  Constitutional: Negative for activity change  and fever.  HENT: Positive for congestion and rhinorrhea. Negative for ear pain.   Eyes: Negative for discharge.  Respiratory: Positive for cough. Negative for shortness of breath and wheezing.   Cardiovascular: Negative for chest pain.       Objective:   Physical Exam  Today's visit was via telephone Physical exam was not possible for this visit       Assessment & Plan:  Probable acute rhinosinusitis antibiotics prescribed warning signs discussed follow-up if ongoing trouble Also I recommend Covid testing because of her being around work [employer] as well as her elderly mother The patient has setbacks to let us know

## 2019-06-28 DIAGNOSIS — L728 Other follicular cysts of the skin and subcutaneous tissue: Secondary | ICD-10-CM | POA: Diagnosis not present

## 2019-06-28 DIAGNOSIS — D485 Neoplasm of uncertain behavior of skin: Secondary | ICD-10-CM | POA: Diagnosis not present

## 2019-06-28 DIAGNOSIS — L570 Actinic keratosis: Secondary | ICD-10-CM | POA: Diagnosis not present

## 2019-07-12 DIAGNOSIS — C44311 Basal cell carcinoma of skin of nose: Secondary | ICD-10-CM | POA: Diagnosis not present

## 2019-08-24 DIAGNOSIS — Z1231 Encounter for screening mammogram for malignant neoplasm of breast: Secondary | ICD-10-CM | POA: Diagnosis not present

## 2019-09-20 ENCOUNTER — Other Ambulatory Visit: Payer: Self-pay | Admitting: Cardiology

## 2019-11-13 ENCOUNTER — Other Ambulatory Visit: Payer: Self-pay | Admitting: Family Medicine

## 2019-11-15 NOTE — Telephone Encounter (Signed)
lvm to schedule appt.  

## 2019-11-16 NOTE — Telephone Encounter (Signed)
lvm to schedule appt.  

## 2019-11-19 NOTE — Telephone Encounter (Signed)
lvm to schedule appt.  

## 2019-12-25 ENCOUNTER — Telehealth: Payer: Self-pay | Admitting: Family Medicine

## 2019-12-25 ENCOUNTER — Other Ambulatory Visit: Payer: Self-pay | Admitting: Nurse Practitioner

## 2019-12-25 DIAGNOSIS — Z1159 Encounter for screening for other viral diseases: Secondary | ICD-10-CM

## 2019-12-25 DIAGNOSIS — Z79899 Other long term (current) drug therapy: Secondary | ICD-10-CM

## 2019-12-25 DIAGNOSIS — E7849 Other hyperlipidemia: Secondary | ICD-10-CM

## 2019-12-25 DIAGNOSIS — I10 Essential (primary) hypertension: Secondary | ICD-10-CM

## 2019-12-25 MED ORDER — ALPRAZOLAM 0.5 MG PO TABS: ORAL_TABLET | ORAL | 2 refills | Status: DC

## 2019-12-25 MED ORDER — FENOFIBRATE 160 MG PO TABS: 160.0000 mg | ORAL_TABLET | Freq: Every day | ORAL | 1 refills | Status: DC

## 2019-12-25 NOTE — Telephone Encounter (Signed)
Please reorder same labs from 01/02/19.  I sent in refills on her meds as requested. Thanks.

## 2019-12-25 NOTE — Telephone Encounter (Signed)
Pt has cpe on 10/22 and would like lab work done.   Also needing refills on fenofibrate 160 MG tablet ALPRAZolam (XANAX) 0.5 MG tablet  [facility] [phone] - [place], [place] - [place] AT [address]

## 2019-12-26 NOTE — Telephone Encounter (Signed)
Labs ordered, lm to notify pt.

## 2020-01-25 ENCOUNTER — Encounter: Payer: BC Managed Care – PPO | Admitting: Nurse Practitioner

## 2020-02-19 LAB — HEPATIC FUNCTION PANEL
ALT: 21 IU/L (ref 0–32)
AST: 21 IU/L (ref 0–40)
Albumin: 4.3 g/dL (ref 3.8–4.9)
Alkaline Phosphatase: 48 IU/L (ref 44–121)
Bilirubin Total: 0.4 mg/dL (ref 0.0–1.2)
Bilirubin, Direct: 0.12 mg/dL (ref 0.00–0.40)
Total Protein: 7.3 g/dL (ref 6.0–8.5)

## 2020-02-19 LAB — LIPID PANEL
Chol/HDL Ratio: 6.1 ratio — ABNORMAL HIGH (ref 0.0–4.4)
Cholesterol, Total: 233 mg/dL — ABNORMAL HIGH (ref 100–199)
HDL: 38 mg/dL — ABNORMAL LOW (ref >39)
LDL Chol Calc (NIH): 168 mg/dL — ABNORMAL HIGH (ref 0–99)
Triglycerides: 146 mg/dL (ref 0–149)
VLDL Cholesterol Cal: 27 mg/dL (ref 5–40)

## 2020-02-19 LAB — CBC WITH DIFFERENTIAL/PLATELET
Basophils Absolute: 0 10*3/uL (ref 0.0–0.2)
Basos: 1 %
EOS (ABSOLUTE): 0.1 10*3/uL (ref 0.0–0.4)
Eos: 2 %
Hematocrit: 42.7 % (ref 34.0–46.6)
Hemoglobin: 14.5 g/dL (ref 11.1–15.9)
Immature Grans (Abs): 0 10*3/uL (ref 0.0–0.1)
Immature Granulocytes: 0 %
Lymphocytes Absolute: 1.8 10*3/uL (ref 0.7–3.1)
Lymphs: 31 %
MCH: 30.3 pg (ref 26.6–33.0)
MCHC: 34 g/dL (ref 31.5–35.7)
MCV: 89 fL (ref 79–97)
Monocytes Absolute: 0.5 10*3/uL (ref 0.1–0.9)
Monocytes: 9 %
Neutrophils Absolute: 3.3 10*3/uL (ref 1.4–7.0)
Neutrophils: 57 %
Platelets: 330 10*3/uL (ref 150–450)
RBC: 4.78 x10E6/uL (ref 3.77–5.28)
RDW: 12.8 % (ref 11.7–15.4)
WBC: 5.8 10*3/uL (ref 3.4–10.8)

## 2020-02-19 LAB — HEPATITIS C ANTIBODY: Hep C Virus Ab: <0.1 s/co ratio (ref 0.0–0.9)

## 2020-02-22 ENCOUNTER — Other Ambulatory Visit: Payer: Self-pay

## 2020-02-22 ENCOUNTER — Encounter: Payer: Self-pay | Admitting: Nurse Practitioner

## 2020-02-22 ENCOUNTER — Ambulatory Visit (INDEPENDENT_AMBULATORY_CARE_PROVIDER_SITE_OTHER): Payer: 59 | Admitting: Nurse Practitioner

## 2020-02-22 VITALS — BP 146/98 | HR 93 | Temp 98.4°F | Wt 171.8 lb

## 2020-02-22 DIAGNOSIS — B9689 Other specified bacterial agents as the cause of diseases classified elsewhere: Secondary | ICD-10-CM

## 2020-02-22 DIAGNOSIS — J019 Acute sinusitis, unspecified: Secondary | ICD-10-CM

## 2020-02-22 MED ORDER — AMOXICILLIN-POT CLAVULANATE 875-125 MG PO TABS: 1.0000 | ORAL_TABLET | Freq: Two times a day (BID) | ORAL | 0 refills | Status: DC

## 2020-02-22 NOTE — Progress Notes (Signed)
Subjective:    Subjective   Patient ID: Faith Mahoney, female    DOB: [birth year], 59 y.o.   MRN: [id number]  HPI: Patient reports itchy watery eyes, cough, right ear pain, tenderness to right forehead and orbital area, and nasal congestion since Tuesday. PMH significant for allergies and hypertension. Patient states her symptoms feel similar to previous sinus infections she had in the past. which act up with weather changes. She also reports dust exposure while cleaning her attic over the past weekend when her allergies started acting up. She denies n/v/d, sore throat, taste disturbances, aches, fever, chills, or SOB. She is currently prescribed Claritin and has taken it daily with additional benadryl OTC and saline nasal spray. She reports minimal relief with current meds. She denies any recent COVID exposure and does not recall recent exposure to anyone who has been sick.      Review of Systems Review of Systems  Constitutional: Negative for chills, fever and malaise/fatigue.  HENT: Positive for congestion, ear pain (right) and sinus pain. Negative for sore throat.        Scratchy throat, yellow mucus from nasal cavity  Eyes: Positive for pain (orbital area), discharge (watery ) and redness. Negative for blurred vision.  Respiratory: Positive for cough. Negative for sputum production, shortness of breath and wheezing.   Cardiovascular: Negative for chest pain, palpitations and orthopnea.  Gastrointestinal: Negative for abdominal pain, diarrhea, nausea and vomiting.  Musculoskeletal: Negative for myalgias.  Neurological: Positive for dizziness (vertigo-mild) and headaches.      Objective:   Objective  Physical Exam Constitutional:      General: She is not in acute distress.    Appearance: Normal appearance. She is not ill-appearing.  HENT:     Right Ear: Tenderness present. Tympanic membrane is retracted. Tympanic membrane is not perforated, erythematous or bulging.      Left Ear: No tenderness. Tympanic membrane is retracted. Tympanic membrane is not perforated or erythematous.     Nose: Congestion and rhinorrhea present. Rhinorrhea is clear.     Right Sinus: Frontal sinus tenderness present.     Left Sinus: Frontal sinus tenderness present.     Mouth/Throat:     Mouth: Mucous membranes are moist.     Pharynx: Uvula midline.     Comments: Mildly injected pharynx  Eyes:     Conjunctiva/sclera:     Right eye: Right conjunctiva is not injected. No exudate.    Left eye: Left conjunctiva is not injected. No exudate. Cardiovascular:     Rate and Rhythm: Normal rate and regular rhythm.     Heart sounds: Normal heart sounds. No murmur heard.  No gallop.   Pulmonary:     Effort: Pulmonary effort is normal. No respiratory distress.     Breath sounds: Normal breath sounds. No wheezing or rhonchi.  Lymphadenopathy:     Cervical: No cervical adenopathy.  Neurological:     Mental Status: She is alert and oriented to person, place, and time.    Today's Vitals   02/22/20 1430  BP: (!) 146/98  Pulse: 93  Temp: 98.4 F (36.9 C)  SpO2: 97%  Weight: 171 lb 12.8 oz (77.9 kg)   Body mass index is 30.43 kg/m.      Assessment & Plan:  Acute bacterial rhinosinusitis   Meds ordered this encounter  Medications  . amoxicillin-clavulanate (AUGMENTIN) 875-125 MG tablet    Sig: Take 1 tablet by mouth 2 (two) times daily.    Dispense:  20 tablet    Refill:  0    Order Specific Question:   Supervising Provider    Answer:   Kathyrn Drown [[id number]]    May use Nasocort/Flonase once a day to relieve nasal pressure/congestion Educated patient regarding appropriate technique for administering steriod nasal spray to avoid irritation. Discussed with patient avoiding decongestants for risk of elevated b/p.  Recommend OTC Coricidin HBP for congestion and cough. Patient will reschedule physical.  Warning signs reviewed.   Return if symptoms worsen or fail to  improve.

## 2020-02-22 NOTE — Progress Notes (Signed)
   Subjective:    Patient ID: Faith Mahoney, female    DOB: Apr 09, 1960, 59 y.o.   MRN: [id number]  Patient reports slight cough and nasal congestion since Tuesday. States it feels like allergies which act up with weather changes. Denies any fever or SOB. Taking OTC allergy/sinus.     Review of Systems     Objective:   Physical Exam        Assessment & Plan:

## 2020-02-22 NOTE — Patient Instructions (Signed)
Nasocort/Flonase once a day Coricidin HBP  Antibiotic

## 2020-02-23 ENCOUNTER — Encounter: Payer: Self-pay | Admitting: Nurse Practitioner

## 2020-04-18 ENCOUNTER — Ambulatory Visit (INDEPENDENT_AMBULATORY_CARE_PROVIDER_SITE_OTHER): Payer: 59 | Admitting: Nurse Practitioner

## 2020-04-18 ENCOUNTER — Other Ambulatory Visit: Payer: Self-pay

## 2020-04-18 ENCOUNTER — Encounter: Payer: Self-pay | Admitting: Nurse Practitioner

## 2020-04-18 VITALS — BP 136/90 | HR 73 | Temp 95.6°F | Ht 61.75 in | Wt 167.0 lb

## 2020-04-18 DIAGNOSIS — Z1329 Encounter for screening for other suspected endocrine disorder: Secondary | ICD-10-CM | POA: Diagnosis not present

## 2020-04-18 MED ORDER — ALPRAZOLAM 0.5 MG PO TABS: ORAL_TABLET | ORAL | 2 refills | Status: DC

## 2020-04-18 MED ORDER — PANTOPRAZOLE SODIUM 40 MG PO TBEC: 40.0000 mg | DELAYED_RELEASE_TABLET | Freq: Every day | ORAL | 1 refills | Status: DC

## 2020-04-18 MED ORDER — FENOFIBRATE 160 MG PO TABS: 160.0000 mg | ORAL_TABLET | Freq: Every day | ORAL | 1 refills | Status: DC

## 2020-04-18 NOTE — Progress Notes (Signed)
Subjective:    Patient ID: Faith Mahoney, female    DOB: 09-May-1960, 60 y.o.   MRN: [id number]  HPI The patient comes in today for a wellness visit.    A review of their health history was completed.  A review of medications was also completed.  Any needed refills; Xanax, Lisinopril, Protonix, Fenofibrate   Eating habits: healthy eating   Falls/  MVA accidents in past few months: none  Regular exercise: walk, yoga, resistant training   Specialist pt sees on regular basis: Chiropractor, massage therapy   Preventative health issues were discussed.   Additional concerns: Hormones. Pt has had 5 deaths in family in December and when she is stressed her hormones do not work as well and she has to take more Xanax.  Patient has taken an early retirement package, has noticed a big decrease in her stress level.  Last BP outside the office was 140/92.  Same sexual partner.  Has had hysterectomy.  No oophorectomy.  No pelvic pain.  Takes about a quarter-half Xanax occasionally only for extreme anxiety.  Also doing massage therapy.  Reflux well controlled if she takes her Protonix daily.  Regular vision and dental care.  Had a mammogram done at [facility], has questions about the report regarding dense breast tissue.  Review of Systems  Constitutional: Negative for activity change, appetite change and fatigue.  HENT: Negative for sinus pressure and sore throat.   Respiratory: Negative for cough, chest tightness, shortness of breath and wheezing.   Cardiovascular: Negative for chest pain.  Gastrointestinal: Negative for abdominal distention, abdominal pain, blood in stool, constipation, diarrhea, nausea and vomiting.  Genitourinary: Negative for difficulty urinating, dysuria, enuresis, frequency, genital sores, menstrual problem, pelvic pain, urgency and vaginal discharge.   Depression screen [facility] 2/9 04/18/2020 02/22/2020 06/24/2017 05/20/2016  Decreased Interest 0 0 0 0  Down,  Depressed, Hopeless 0 0 1 0  PHQ - 2 Score 0 0 1 0        Objective:   Physical Exam Constitutional:      General: She is not in acute distress.    Appearance: She is well-developed.  HENT:     Mouth/Throat:     Mouth: Oropharynx is clear and moist.  Neck:     Thyroid: No thyromegaly.     Trachea: No tracheal deviation.     Comments: Thyroid nontender to palpation, no mass or goiter noted. Cardiovascular:     Rate and Rhythm: Normal rate and regular rhythm.     Heart sounds: Normal heart sounds. No murmur heard. No gallop.   Pulmonary:     Effort: Pulmonary effort is normal.     Breath sounds: Normal breath sounds.  Chest:  Breasts:     Right: No swelling, inverted nipple, mass, skin change, tenderness, axillary adenopathy or supraclavicular adenopathy.     Left: No swelling, inverted nipple, mass, skin change, tenderness, axillary adenopathy or supraclavicular adenopathy.    Abdominal:     General: There is no distension.     Palpations: Abdomen is soft.     Tenderness: There is no abdominal tenderness.  Genitourinary:    Vagina: Normal. No vaginal discharge.     Uterus: Normal.      Comments: External GU no rashes or lesions.  Vagina no discharge or irritation noted.  Bimanual exam no tenderness or obvious masses. Musculoskeletal:        General: No edema.     Cervical back: Normal range of motion  and neck supple.  Lymphadenopathy:     Cervical: No cervical adenopathy.     Upper Body:     Right upper body: No supraclavicular, axillary or pectoral adenopathy.     Left upper body: No supraclavicular, axillary or pectoral adenopathy.  Skin:    General: Skin is warm and dry.     Findings: No rash.  Neurological:     Mental Status: She is alert and oriented to person, place, and time.  Psychiatric:        Mood and Affect: Mood and affect and mood normal.        Behavior: Behavior normal.        Thought Content: Thought content normal.        Judgment: Judgment  normal.    Today's Vitals   04/18/20 1038  BP: 136/90  Pulse: 73  Temp: (!) 95.6 F (35.3 C)  SpO2: 96%  Weight: 167 lb (75.8 kg)  Height: 5' 1.75" (1.568 m)   Body mass index is 30.79 kg/m.      Assessment & Plan:   Problem List Items Addressed This Visit   None   Visit Diagnoses    Thyroid disorder screening    -  Primary   Relevant Orders   TSH (Completed)     Meds ordered this encounter  Medications  . pantoprazole (PROTONIX) 40 MG tablet    Sig: Take 1 tablet (40 mg total) by mouth daily.    Dispense:  90 tablet    Refill:  1    Order Specific Question:   Supervising Provider    Answer:   Sallee Lange A [[id number]]  . fenofibrate 160 MG tablet    Sig: Take 1 tablet (160 mg total) by mouth daily. with food    Dispense:  90 tablet    Refill:  1    Order Specific Question:   Supervising Provider    Answer:   Sallee Lange A [[id number]]  . ALPRAZolam (XANAX) 0.5 MG tablet    Sig: take 1 tablet by mouth twice a day if needed    Dispense:  30 tablet    Refill:  2    Order Specific Question:   Supervising Provider    Answer:   Sallee Lange A [[id number]]   Patient has a strong family history of thyroid disorders, we will order a TSH.  Exam is normal. Continue to use alprazolam sparingly. Discussed issues associated with class C breast density.  Patient understands that small masses may be obscured with dense tissue.  Due to her strong family history of various types of cancer, encourage patient to consider genetic testing.  Let us know if she would like to do this and we can set it up. Continue healthy habits and stress reduction. Return in about 6 months (around 10/16/2020) for BP and cholestrol. Call back sooner if needed.

## 2020-04-19 ENCOUNTER — Encounter: Payer: Self-pay | Admitting: Nurse Practitioner

## 2020-04-19 LAB — TSH: TSH: 1.49 u[IU]/mL (ref 0.450–4.500)

## 2020-07-27 ENCOUNTER — Other Ambulatory Visit: Payer: Self-pay | Admitting: Family Medicine

## 2020-10-27 ENCOUNTER — Telehealth: Payer: Self-pay | Admitting: Family Medicine

## 2020-10-27 DIAGNOSIS — I10 Essential (primary) hypertension: Secondary | ICD-10-CM

## 2020-10-27 DIAGNOSIS — E7849 Other hyperlipidemia: Secondary | ICD-10-CM

## 2020-10-27 DIAGNOSIS — Z79899 Other long term (current) drug therapy: Secondary | ICD-10-CM

## 2020-10-27 NOTE — Telephone Encounter (Signed)
Last labs 04/18/20: Lipid, Liver, CBC, Hep C TSH

## 2020-10-27 NOTE — Telephone Encounter (Signed)
Pt is scheduling her app for follow up and states that she will need to have blood work done pt is request call back once this is complete and orders are in

## 2020-11-06 NOTE — Telephone Encounter (Signed)
Blood work ordered in Epic. Patient notified. 

## 2020-11-06 NOTE — Telephone Encounter (Signed)
Nilda Simmer, NP     Lipid, CMP and CBC please. Thanks.

## 2020-11-14 ENCOUNTER — Ambulatory Visit: Payer: 59 | Admitting: Nurse Practitioner

## 2020-12-16 LAB — CBC WITH DIFFERENTIAL/PLATELET
Basophils Absolute: 0 10*3/uL (ref 0.0–0.2)
Basos: 1 %
EOS (ABSOLUTE): 0.1 10*3/uL (ref 0.0–0.4)
Eos: 2 %
Hematocrit: 42.2 % (ref 34.0–46.6)
Hemoglobin: 14 g/dL (ref 11.1–15.9)
Immature Grans (Abs): 0 10*3/uL (ref 0.0–0.1)
Immature Granulocytes: 1 %
Lymphocytes Absolute: 1.8 10*3/uL (ref 0.7–3.1)
Lymphs: 30 %
MCH: 29.6 pg (ref 26.6–33.0)
MCHC: 33.2 g/dL (ref 31.5–35.7)
MCV: 89 fL (ref 79–97)
Monocytes Absolute: 0.5 10*3/uL (ref 0.1–0.9)
Monocytes: 8 %
Neutrophils Absolute: 3.5 10*3/uL (ref 1.4–7.0)
Neutrophils: 58 %
Platelets: 331 10*3/uL (ref 150–450)
RBC: 4.73 x10E6/uL (ref 3.77–5.28)
RDW: 12.8 % (ref 11.7–15.4)
WBC: 6 10*3/uL (ref 3.4–10.8)

## 2020-12-16 LAB — COMPREHENSIVE METABOLIC PANEL
ALT: 25 IU/L (ref 0–32)
AST: 24 IU/L (ref 0–40)
Albumin/Globulin Ratio: 1.7 (ref 1.2–2.2)
Albumin: 4.5 g/dL (ref 3.8–4.9)
Alkaline Phosphatase: 54 IU/L (ref 44–121)
BUN/Creatinine Ratio: 20 (ref 12–28)
BUN: 12 mg/dL (ref 8–27)
Bilirubin Total: 0.3 mg/dL (ref 0.0–1.2)
CO2: 17 mmol/L — ABNORMAL LOW (ref 20–29)
Calcium: 9.6 mg/dL (ref 8.7–10.3)
Chloride: 105 mmol/L (ref 96–106)
Creatinine, Ser: 0.6 mg/dL (ref 0.57–1.00)
Globulin, Total: 2.7 g/dL (ref 1.5–4.5)
Glucose: 102 mg/dL — ABNORMAL HIGH (ref 65–99)
Potassium: 4.3 mmol/L (ref 3.5–5.2)
Sodium: 141 mmol/L (ref 134–144)
Total Protein: 7.2 g/dL (ref 6.0–8.5)
eGFR: 103 mL/min/{1.73_m2} (ref >59)

## 2020-12-16 LAB — LIPID PANEL
Chol/HDL Ratio: 6.3 ratio — ABNORMAL HIGH (ref 0.0–4.4)
Cholesterol, Total: 258 mg/dL — ABNORMAL HIGH (ref 100–199)
HDL: 41 mg/dL (ref >39)
LDL Chol Calc (NIH): 183 mg/dL — ABNORMAL HIGH (ref 0–99)
Triglycerides: 180 mg/dL — ABNORMAL HIGH (ref 0–149)
VLDL Cholesterol Cal: 34 mg/dL (ref 5–40)

## 2020-12-19 ENCOUNTER — Other Ambulatory Visit: Payer: Self-pay

## 2020-12-19 ENCOUNTER — Encounter: Payer: Self-pay | Admitting: Nurse Practitioner

## 2020-12-19 ENCOUNTER — Ambulatory Visit (INDEPENDENT_AMBULATORY_CARE_PROVIDER_SITE_OTHER): Payer: 59 | Admitting: Nurse Practitioner

## 2020-12-19 VITALS — BP 136/86 | Ht 61.75 in | Wt 167.0 lb

## 2020-12-19 DIAGNOSIS — G5691 Unspecified mononeuropathy of right upper limb: Secondary | ICD-10-CM

## 2020-12-19 DIAGNOSIS — I10 Essential (primary) hypertension: Secondary | ICD-10-CM | POA: Diagnosis not present

## 2020-12-19 DIAGNOSIS — E7849 Other hyperlipidemia: Secondary | ICD-10-CM

## 2020-12-19 MED ORDER — GABAPENTIN 100 MG PO CAPS: 100.0000 mg | ORAL_CAPSULE | Freq: Every day | ORAL | 0 refills | Status: DC

## 2020-12-19 MED ORDER — ROSUVASTATIN CALCIUM 10 MG PO TABS: ORAL_TABLET | ORAL | 1 refills | Status: DC

## 2020-12-19 MED ORDER — LISINOPRIL 10 MG PO TABS: ORAL_TABLET | ORAL | 1 refills | Status: DC

## 2020-12-19 NOTE — Progress Notes (Signed)
Subjective:    Patient ID: Faith Mahoney, female    DOB: 05/06/1960, 60 y.o.   MRN: [id number]  HPI  Patient arrives for a follow up on hypertension. Patient states she has been having problems with carpel tunnel syndrome. She tried a family members gabapentin and it helped and she is interested in [place]. 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  Doing much better as far as her stress level.  Checks her blood pressure on a rare occasion at home, has been running well. States she does skip doses of her rosuvastatin, currently on a Monday Wednesday Friday regimen. Otherwise adherent to medication regimen. Doing well with her healthy diet and staying very active. Also having some tendinitis with some neuropathic symptoms in her right hand particularly around the index finger and thumb.  Has been doing a lot more work with her hands such as painting and renovations in her home which she thinks is flared this up.  Does wear a brace at nighttime.  Took a dose of her daughters gabapentin, unsure of the dose, states that this greatly helped her pain and burning in her hand.  Depression screen PHQ 2/9 12/19/2020  Decreased Interest 0  Down, Depressed, Hopeless 0  PHQ - 2 Score 0    Review of Systems  Respiratory:  Negative for chest tightness and shortness of breath.   Cardiovascular:  Negative for chest pain and leg swelling.      Objective:   Physical Exam NAD.  Alert, oriented.  Cheerful affect.  Lungs clear.  Heart regular rate rhythm.  Carotids no bruits or thrills.  Lower extremities no edema.  Can perform full active and passive ROM of the right wrist.  Hand strength 5+ and equal bilaterally.  Strong right radial pulse. Today's Vitals   12/19/20 1358  BP: 136/86  Weight: 167 lb (75.8 kg)  Height: 5' 1.75" (1.568 m)   Body mass index is 30.79 kg/m. Results for orders placed or performed in visit on 10/27/20  Lipid panel  Result Value Ref Range   Cholesterol, Total  258 (H) 100 - 199 mg/dL   Triglycerides 180 (H) 0 - 149 mg/dL   HDL 41 >39 mg/dL   VLDL Cholesterol Cal 34 5 - 40 mg/dL   LDL Chol Calc (NIH) 183 (H) 0 - 99 mg/dL   Chol/HDL Ratio 6.3 (H) 0.0 - 4.4 ratio  Comprehensive metabolic panel  Result Value Ref Range   Glucose 102 (H) 65 - 99 mg/dL   BUN 12 8 - 27 mg/dL   Creatinine, Ser 0.60 0.57 - 1.00 mg/dL   eGFR 103 >59 mL/min/1.73   BUN/Creatinine Ratio 20 12 - 28   Sodium 141 134 - 144 mmol/L   Potassium 4.3 3.5 - 5.2 mmol/L   Chloride 105 96 - 106 mmol/L   CO2 17 (L) 20 - 29 mmol/L   Calcium 9.6 8.7 - 10.3 mg/dL   Total Protein 7.2 6.0 - 8.5 g/dL   Albumin 4.5 3.8 - 4.9 g/dL   Globulin, Total 2.7 1.5 - 4.5 g/dL   Albumin/Globulin Ratio 1.7 1.2 - 2.2   Bilirubin Total 0.3 0.0 - 1.2 mg/dL   Alkaline Phosphatase 54 44 - 121 IU/L   AST 24 0 - 40 IU/L   ALT 25 0 - 32 IU/L  CBC with Differential/Platelet  Result Value Ref Range   WBC 6.0 3.4 - 10.8 x10E3/uL   RBC 4.73 3.77 - 5.28 x10E6/uL   Hemoglobin 14.0  11.1 - 15.9 g/dL   Hematocrit 42.2 34.0 - 46.6 %   MCV 89 79 - 97 fL   MCH 29.6 26.6 - 33.0 pg   MCHC 33.2 31.5 - 35.7 g/dL   RDW 12.8 11.7 - 15.4 %   Platelets 331 150 - 450 x10E3/uL   Neutrophils 58 Not Estab. %   Lymphs 30 Not Estab. %   Monocytes 8 Not Estab. %   Eos 2 Not Estab. %   Basos 1 Not Estab. %   Neutrophils Absolute 3.5 1.4 - 7.0 x10E3/uL   Lymphocytes Absolute 1.8 0.7 - 3.1 x10E3/uL   Monocytes Absolute 0.5 0.1 - 0.9 x10E3/uL   EOS (ABSOLUTE) 0.1 0.0 - 0.4 x10E3/uL   Basophils Absolute 0.0 0.0 - 0.2 x10E3/uL   Immature Granulocytes 1 Not Estab. %   Immature Grans (Abs) 0.0 0.0 - 0.1 x10E3/uL   Reviewed most recent labs with patient.       Assessment & Plan:   Problem List Items Addressed This Visit       Cardiovascular and Mediastinum   Hypertension - Primary   Relevant Medications   lisinopril (ZESTRIL) 10 MG tablet   rosuvastatin (CRESTOR) 10 MG tablet     Nervous and Auditory    Neuropathy of right hand   Relevant Medications   gabapentin (NEURONTIN) 100 MG capsule     Other   Hyperlipidemia (Chronic)   Relevant Medications   lisinopril (ZESTRIL) 10 MG tablet   rosuvastatin (CRESTOR) 10 MG tablet   Meds ordered this encounter  Medications   lisinopril (ZESTRIL) 10 MG tablet    Sig: TAKE 1 TABLET(10 MG) BY MOUTH DAILY    Dispense:  90 tablet    Refill:  1    Order Specific Question:   Supervising Provider    Answer:   Sallee Lange A [[id number]]   rosuvastatin (CRESTOR) 10 MG tablet    Sig: Take 1 tablet 10 mg every other day    Dispense:  45 tablet    Refill:  1    Order Specific Question:   Supervising Provider    Answer:   Sallee Lange A [[id number]]   gabapentin (NEURONTIN) 100 MG capsule    Sig: Take 1 capsule (100 mg total) by mouth at bedtime. For wrist pain    Dispense:  30 capsule    Refill:  0    Order Specific Question:   Supervising Provider    Answer:   Sallee Lange A [[id number]]   Increase Crestor to 10 mg every other day with repeat labs in 6 months.  Patient agrees with this plan. Continue healthy lifestyle including healthy diet and regular activity. Trial of low-dose gabapentin at bedtime, plan to slowly titrate if needed to control neuropathic pain in her right hand.  Patient defers referral to hand specialist at this time. Recommend flu vaccine and yearly wellness exam. Return in about 6 months (around 06/18/2021) for Also recommend wellness exam.

## 2020-12-20 ENCOUNTER — Encounter: Payer: Self-pay | Admitting: Nurse Practitioner

## 2020-12-20 DIAGNOSIS — G5691 Unspecified mononeuropathy of right upper limb: Secondary | ICD-10-CM | POA: Insufficient documentation

## 2020-12-25 ENCOUNTER — Other Ambulatory Visit: Payer: Self-pay | Admitting: Family Medicine

## 2020-12-26 ENCOUNTER — Other Ambulatory Visit: Payer: Self-pay | Admitting: Nurse Practitioner

## 2020-12-26 ENCOUNTER — Other Ambulatory Visit: Payer: Self-pay | Admitting: Family Medicine

## 2021-01-05 LAB — HM MAMMOGRAPHY: HM Mammogram: NORMAL (ref 0–4)

## 2021-01-06 ENCOUNTER — Other Ambulatory Visit: Payer: Self-pay | Admitting: Family Medicine

## 2021-01-12 ENCOUNTER — Encounter: Payer: Self-pay | Admitting: Family Medicine

## 2021-02-06 ENCOUNTER — Other Ambulatory Visit: Payer: Self-pay | Admitting: Nurse Practitioner

## 2021-07-13 ENCOUNTER — Other Ambulatory Visit: Payer: Self-pay | Admitting: Nurse Practitioner

## 2021-07-20 ENCOUNTER — Other Ambulatory Visit: Payer: Self-pay | Admitting: Family Medicine

## 2021-08-22 ENCOUNTER — Other Ambulatory Visit: Payer: Self-pay | Admitting: Nurse Practitioner

## 2021-09-23 ENCOUNTER — Encounter (INDEPENDENT_AMBULATORY_CARE_PROVIDER_SITE_OTHER): Payer: Self-pay | Admitting: *Deleted

## 2021-10-11 ENCOUNTER — Other Ambulatory Visit: Payer: Self-pay | Admitting: Family Medicine

## 2021-11-10 ENCOUNTER — Other Ambulatory Visit: Payer: Self-pay | Admitting: Family Medicine

## 2021-11-16 ENCOUNTER — Other Ambulatory Visit: Payer: Self-pay | Admitting: Nurse Practitioner

## 2021-12-10 ENCOUNTER — Other Ambulatory Visit: Payer: Self-pay | Admitting: Family Medicine

## 2021-12-31 ENCOUNTER — Telehealth: Payer: Self-pay

## 2021-12-31 MED ORDER — EZETIMIBE 10 MG PO TABS: ORAL_TABLET | ORAL | 1 refills | Status: DC

## 2021-12-31 NOTE — Telephone Encounter (Signed)
Encourage patient to contact the pharmacy for refills or they can request refills through [facility]  (Please schedule appointment if patient has not been seen in over a year)    WHAT PHARMACY WOULD THEY LIKE THIS SENT TO: [facility] [phone] - [place], [place] [address]  [place], [place] [phone]   MEDICATION NAME & DOSE:ezetimibe (ZETIA) 10 MG tablet   NOTES/COMMENTS FROM PATIENT:      [place] office please notify patient: It takes 48-72 hours to process rx refill requests Ask patient to call pharmacy to ensure rx is ready before heading there.

## 2021-12-31 NOTE — Telephone Encounter (Signed)
Medication sent to pharmacy. Left message to return call 

## 2021-12-31 NOTE — Telephone Encounter (Signed)
Last filled by Dr.McDowell. please advise. Thank you

## 2021-12-31 NOTE — Telephone Encounter (Signed)
Pt returned call and verbalized understanding. Pt utilizing [employer]

## 2021-12-31 NOTE — Telephone Encounter (Signed)
It would be fine to give her 90-day supply with 1 refill follow-up with Chrys Racer as planned

## 2022-01-08 ENCOUNTER — Encounter: Payer: Self-pay | Admitting: Nurse Practitioner

## 2022-01-09 ENCOUNTER — Encounter: Payer: Self-pay | Admitting: Nurse Practitioner

## 2022-01-09 ENCOUNTER — Other Ambulatory Visit: Payer: Self-pay | Admitting: Family Medicine

## 2022-01-29 ENCOUNTER — Other Ambulatory Visit (HOSPITAL_COMMUNITY): Payer: Self-pay | Admitting: Nurse Practitioner

## 2022-01-29 ENCOUNTER — Ambulatory Visit (INDEPENDENT_AMBULATORY_CARE_PROVIDER_SITE_OTHER): Payer: 59 | Admitting: Nurse Practitioner

## 2022-01-29 VITALS — BP 148/96 | HR 74 | Temp 97.9°F | Ht 61.75 in | Wt 169.0 lb

## 2022-01-29 DIAGNOSIS — S61230A Puncture wound without foreign body of right index finger without damage to nail, initial encounter: Secondary | ICD-10-CM

## 2022-01-29 DIAGNOSIS — Z01419 Encounter for gynecological examination (general) (routine) without abnormal findings: Secondary | ICD-10-CM | POA: Diagnosis not present

## 2022-01-29 DIAGNOSIS — Z1211 Encounter for screening for malignant neoplasm of colon: Secondary | ICD-10-CM | POA: Diagnosis not present

## 2022-01-29 DIAGNOSIS — Z1231 Encounter for screening mammogram for malignant neoplasm of breast: Secondary | ICD-10-CM

## 2022-01-29 DIAGNOSIS — Z0000 Encounter for general adult medical examination without abnormal findings: Secondary | ICD-10-CM

## 2022-01-29 DIAGNOSIS — Z23 Encounter for immunization: Secondary | ICD-10-CM

## 2022-01-29 DIAGNOSIS — E7849 Other hyperlipidemia: Secondary | ICD-10-CM

## 2022-01-29 DIAGNOSIS — Z780 Asymptomatic menopausal state: Secondary | ICD-10-CM

## 2022-01-29 DIAGNOSIS — I10 Essential (primary) hypertension: Secondary | ICD-10-CM

## 2022-01-29 DIAGNOSIS — Z1329 Encounter for screening for other suspected endocrine disorder: Secondary | ICD-10-CM

## 2022-01-29 DIAGNOSIS — Z1239 Encounter for other screening for malignant neoplasm of breast: Secondary | ICD-10-CM

## 2022-01-29 MED ORDER — LISINOPRIL 20 MG PO TABS: 20.0000 mg | ORAL_TABLET | Freq: Every day | ORAL | 0 refills | Status: DC

## 2022-01-29 NOTE — Progress Notes (Unsigned)
Subjective:    Patient ID: Faith Mahoney, female    DOB: 1960-05-05, 61 y.o.   MRN: [id number]  HPI The patient comes in today for a wellness visit.    A review of their health history was completed.  A review of medications was also completed.  Any needed refills; all  Eating habits: good  Falls/  MVA accidents in past few months: no  Regular exercise: walking, eliptical, bike, yoga  Specialist pt sees on regular basis: no  Preventative health issues were discussed.   Additional concerns:   HPI: Annual wellness physical exam today. Retired now from [employer]. Wearing BIL braces on wrists at night for carpal tunnel with good results. Reporting joint pain controlled by chondroitin/glucosamine OTC and chiropractic. This treatment also controls her neuropathy symptoms. Reports taking one week of gabapentin and stopping after reading up on SE. Wears bite guard at night. Takes Xanax only occasionally for anxiety. States woke up with eyes puffy and itchy. States throat has been sore and scratchy with mild cough due to seasonal allergies. Taking OTC Zyrtec alternating with Claritin for this with good result. Diet good but "could be better." Exercises riding bike, yoga, and elliptical several times weekly. Has taken Zetia for hyperlipidemia for past year but has not been compliant with Crestor 2-3 times weekly, as prescribed by Dr. Domenic Polite. Declines flu shot.  Up to date on vision and dental exams. History of grinding her teeth. Same sexual partner since last PE with no reports of vaginal bleeding, pain, or discharge. Defers pelvic exam.   Review of Systems  Constitutional:  Negative for activity change, appetite change and fatigue.  HENT:  Positive for congestion, postnasal drip, rhinorrhea, sinus pressure and sore throat. Negative for dental problem, ear discharge, ear pain, hearing loss and trouble swallowing.   Eyes:  Positive for redness and itching. Negative for visual  disturbance.  Respiratory:  Negative for cough, chest tightness, shortness of breath and wheezing.   Cardiovascular:  Negative for chest pain, palpitations and leg swelling.  Gastrointestinal:  Negative for abdominal distention, abdominal pain, blood in stool, constipation, diarrhea, nausea and vomiting.  Genitourinary:  Negative for difficulty urinating, dyspareunia, dysuria, enuresis, frequency, genital sores, pelvic pain, urgency, vaginal bleeding, vaginal discharge and vaginal pain.  Musculoskeletal:  Positive for arthralgias.  Skin:  Negative for rash and wound.  Neurological:  Negative for dizziness and headaches.  Psychiatric/Behavioral:  Negative for sleep disturbance. The patient is nervous/anxious.       01/29/2022    1:27 PM  Depression screen PHQ 2/9  Decreased Interest 0  Down, Depressed, Hopeless 0  PHQ - 2 Score 0  Altered sleeping 0  Tired, decreased energy 0  Change in appetite 0  Feeling bad or failure about yourself  0  Trouble concentrating 0  Moving slowly or fidgety/restless 0  Suicidal thoughts 0  PHQ-9 Score 0         01/29/2022    1:27 PM 06/24/2017   11:26 AM  GAD 7 : Generalized Anxiety Score  Nervous, Anxious, on Edge 0 3  Control/stop worrying 0 3  Worry too much - different things 0 3  Trouble relaxing 0 3  Restless 0 3  Easily annoyed or irritable 0 3  Afraid - awful might happen 0 3  Total GAD 7 Score 0 21  Anxiety Difficulty  Somewhat difficult       Objective:   Physical Exam Vitals and nursing note reviewed. Exam conducted with a  chaperone present.  Constitutional:      General: She is not in acute distress.    Appearance: She is well-developed. She is not ill-appearing.  HENT:     Head:     Comments: Thyroid non tender to palpation. No mass or gointer noted. R preauricular area at the TMJ enlarged with palpable nodule approx 2 cm Mild edema spreading into facial area with no warmth or erythema.     Left Ear: Tympanic membrane  normal.     Ears:     Comments: R TM retracted. No erythema. No drainage.     Mouth/Throat:     Mouth: Mucous membranes are moist.     Pharynx: Oropharynx is clear.     Comments: R side of throat mildly injected. No exudate. No lesions.  Eyes:     Comments: Mild bilaterally injected sclera. No BIL discharge.   Neck:     Thyroid: No thyromegaly.     Trachea: No tracheal deviation.     Comments: Thyroid non tender to palpation. No mass or goiter noted. R preauricular area at the TMJ enlarged with palpable mass approx 2 cm.  No erythema or warmth. Free movement of joint with no tenderness or popping.  Cardiovascular:     Rate and Rhythm: Normal rate and regular rhythm.     Heart sounds: Normal heart sounds. No murmur heard. Pulmonary:     Effort: Pulmonary effort is normal.     Breath sounds: Normal breath sounds. No wheezing or rhonchi.  Chest:  Breasts:    Right: No swelling, inverted nipple, mass, skin change or tenderness.     Left: No swelling, inverted nipple, mass, skin change or tenderness.  Abdominal:     General: There is no distension.     Palpations: Abdomen is soft.     Tenderness: There is no abdominal tenderness.  Genitourinary:    Comments: Deferred Pelvic exam. Has had a hysterectomy. Denies problems.  Musculoskeletal:     Cervical back: Normal range of motion and neck supple.     Right lower leg: No edema.     Left lower leg: No edema.  Lymphadenopathy:     Cervical: No cervical adenopathy.     Upper Body:     Right upper body: No supraclavicular, axillary or pectoral adenopathy.     Left upper body: No supraclavicular, axillary or pectoral adenopathy.  Skin:    General: Skin is warm and dry.     Findings: No rash.     Comments: Noted superficial puncture wounds; 1 on R palm from gardening and working with roses and another on R index finger. No evidence of infection.  Neurological:     Mental Status: She is alert and oriented to person, place, and time.   Psychiatric:        Mood and Affect: Mood normal.        Behavior: Behavior normal.        Thought Content: Thought content normal.        Judgment: Judgment normal.     Vitals:   01/29/22 0952  BP: (!) 148/96  Pulse: 74  Temp: 97.9 F (36.6 C)  Height: 5' 1.75" (1.568 m)  Weight: 76.7 kg  SpO2: 97%  BMI (Calculated): 31.18   Blood pressure rechecked was 160/96 manual R arm sitting.     Assessment & Plan:   Problem List Items Addressed This Visit       Cardiovascular and Mediastinum   Primary hypertension  Relevant Medications   lisinopril (ZESTRIL) 20 MG tablet   Other Relevant Orders   Lipid Panel   CBC with Differential   Comprehensive metabolic panel   TSH     Other   Hyperlipidemia (Chronic)   Relevant Medications   lisinopril (ZESTRIL) 20 MG tablet   Other Relevant Orders   Lipid Panel   Post-menopausal   Other Visit Diagnoses     Well woman exam    -  Primary   Relevant Orders   Lipid Panel   CBC with Differential   Comprehensive metabolic panel   TSH   Puncture wound of right index finger       Relevant Orders   Tdap vaccine greater than or equal to 7yo IM (Completed)   Encounter for screening colonoscopy       Relevant Orders   Ambulatory referral to Gastroenterology   Encounter for screening mammogram for malignant neoplasm of breast       Thyroid disorder screening          Meds ordered this encounter  Medications   lisinopril (ZESTRIL) 20 MG tablet    Sig: Take 1 tablet (20 mg total) by mouth daily. For BP    Dispense:  90 tablet    Refill:  0    Order Specific Question:   Supervising Provider    Answer:   Sallee Lange A [[id number]]    Plan: Tdap vaccine today. Call back if any signs of wound infection. Colonoscopy and mammogram scheduled. Baseline DEXA bone density scan ordered. Fasting blood work ordered. Further follow up on lipids based on results. Recommended increasing Lisinopril dose from 10 to 20 mg. Monitor BP outside of  office and cal back if remains above 140/90. Continue healthy lifestyle activities. Return in about 3 months (around 05/01/2022) for BP check up.

## 2022-01-30 ENCOUNTER — Encounter: Payer: Self-pay | Admitting: Nurse Practitioner

## 2022-01-30 DIAGNOSIS — Z780 Asymptomatic menopausal state: Secondary | ICD-10-CM | POA: Insufficient documentation

## 2022-02-01 ENCOUNTER — Encounter (INDEPENDENT_AMBULATORY_CARE_PROVIDER_SITE_OTHER): Payer: Self-pay | Admitting: *Deleted

## 2022-02-08 ENCOUNTER — Other Ambulatory Visit: Payer: Self-pay | Admitting: Family Medicine

## 2022-02-08 MED ORDER — FENOFIBRATE 160 MG PO TABS: ORAL_TABLET | ORAL | 0 refills | Status: DC

## 2022-02-22 ENCOUNTER — Ambulatory Visit (HOSPITAL_COMMUNITY)
Admission: RE | Admit: 2022-02-22 | Discharge: 2022-02-22 | Disposition: A | Discharge status: Home / Self Care | Payer: 59 | Source: Ambulatory Visit | Attending: Nurse Practitioner | Admitting: Nurse Practitioner

## 2022-02-22 DIAGNOSIS — Z1239 Encounter for other screening for malignant neoplasm of breast: Secondary | ICD-10-CM | POA: Insufficient documentation

## 2022-02-22 DIAGNOSIS — Z780 Asymptomatic menopausal state: Secondary | ICD-10-CM | POA: Diagnosis present

## 2022-02-22 DIAGNOSIS — Z0000 Encounter for general adult medical examination without abnormal findings: Secondary | ICD-10-CM | POA: Diagnosis present

## 2022-02-23 ENCOUNTER — Encounter: Payer: Self-pay | Admitting: Nurse Practitioner

## 2022-02-23 DIAGNOSIS — M8580 Other specified disorders of bone density and structure, unspecified site: Secondary | ICD-10-CM | POA: Insufficient documentation

## 2022-04-13 ENCOUNTER — Other Ambulatory Visit: Payer: Self-pay

## 2022-04-13 MED ORDER — FENOFIBRATE 160 MG PO TABS: ORAL_TABLET | ORAL | 1 refills | Status: DC

## 2022-05-03 ENCOUNTER — Ambulatory Visit: Payer: Self-pay | Admitting: Family Medicine

## 2022-05-28 LAB — CBC WITH DIFFERENTIAL/PLATELET
Basophils Absolute: 0 10*3/uL (ref 0.0–0.2)
Basos: 1 %
EOS (ABSOLUTE): 0.1 10*3/uL (ref 0.0–0.4)
Eos: 2 %
Hematocrit: 42.8 % (ref 34.0–46.6)
Hemoglobin: 14 g/dL (ref 11.1–15.9)
Immature Grans (Abs): 0 10*3/uL (ref 0.0–0.1)
Immature Granulocytes: 0 %
Lymphocytes Absolute: 2 10*3/uL (ref 0.7–3.1)
Lymphs: 31 %
MCH: 29.5 pg (ref 26.6–33.0)
MCHC: 32.7 g/dL (ref 31.5–35.7)
MCV: 90 fL (ref 79–97)
Monocytes Absolute: 0.6 10*3/uL (ref 0.1–0.9)
Monocytes: 9 %
Neutrophils Absolute: 3.8 10*3/uL (ref 1.4–7.0)
Neutrophils: 57 %
Platelets: 445 10*3/uL (ref 150–450)
RBC: 4.75 x10E6/uL (ref 3.77–5.28)
RDW: 13.1 % (ref 11.7–15.4)
WBC: 6.7 10*3/uL (ref 3.4–10.8)

## 2022-05-28 LAB — LIPID PANEL
Chol/HDL Ratio: 6.9 ratio — ABNORMAL HIGH (ref 0.0–4.4)
Cholesterol, Total: 247 mg/dL — ABNORMAL HIGH (ref 100–199)
HDL: 36 mg/dL — ABNORMAL LOW (ref >39)
LDL Chol Calc (NIH): 178 mg/dL — ABNORMAL HIGH (ref 0–99)
Triglycerides: 178 mg/dL — ABNORMAL HIGH (ref 0–149)
VLDL Cholesterol Cal: 33 mg/dL (ref 5–40)

## 2022-05-28 LAB — COMPREHENSIVE METABOLIC PANEL
ALT: 24 IU/L (ref 0–32)
AST: 21 IU/L (ref 0–40)
Albumin/Globulin Ratio: 1.5 (ref 1.2–2.2)
Albumin: 4.6 g/dL (ref 3.9–4.9)
Alkaline Phosphatase: 56 IU/L (ref 44–121)
BUN/Creatinine Ratio: 17 (ref 12–28)
BUN: 13 mg/dL (ref 8–27)
Bilirubin Total: 0.3 mg/dL (ref 0.0–1.2)
CO2: 21 mmol/L (ref 20–29)
Calcium: 10.1 mg/dL (ref 8.7–10.3)
Chloride: 102 mmol/L (ref 96–106)
Creatinine, Ser: 0.75 mg/dL (ref 0.57–1.00)
Globulin, Total: 3 g/dL (ref 1.5–4.5)
Glucose: 102 mg/dL — ABNORMAL HIGH (ref 70–99)
Potassium: 4.4 mmol/L (ref 3.5–5.2)
Sodium: 140 mmol/L (ref 134–144)
Total Protein: 7.6 g/dL (ref 6.0–8.5)
eGFR: 90 mL/min/{1.73_m2} (ref >59)

## 2022-05-28 LAB — TSH: TSH: 1.52 u[IU]/mL (ref 0.450–4.500)

## 2022-05-31 ENCOUNTER — Ambulatory Visit (INDEPENDENT_AMBULATORY_CARE_PROVIDER_SITE_OTHER): Payer: 59 | Admitting: Family Medicine

## 2022-05-31 ENCOUNTER — Encounter (INDEPENDENT_AMBULATORY_CARE_PROVIDER_SITE_OTHER): Payer: Self-pay | Admitting: *Deleted

## 2022-05-31 VITALS — BP 130/86 | HR 84 | Wt 166.0 lb

## 2022-05-31 DIAGNOSIS — J019 Acute sinusitis, unspecified: Secondary | ICD-10-CM

## 2022-05-31 DIAGNOSIS — I10 Essential (primary) hypertension: Secondary | ICD-10-CM

## 2022-05-31 DIAGNOSIS — E7849 Other hyperlipidemia: Secondary | ICD-10-CM | POA: Diagnosis not present

## 2022-05-31 MED ORDER — AMOXICILLIN 500 MG PO CAPS: 500.0000 mg | ORAL_CAPSULE | Freq: Three times a day (TID) | ORAL | 0 refills | Status: AC

## 2022-05-31 NOTE — Progress Notes (Signed)
   Subjective:    Patient ID: Faith Mahoney, female    DOB: 1961/03/22, 62 y.o.   MRN: [id number]  HPI Patient arrives today for BP and lab work.   Patient states she is having facial pain, sinus pressure, headache, and nasal congestion. X 2 weeks gradually getting worse.  The 10-year ASCVD risk score (Arnett DK, et al., 2019) is: 8.2%   Values used to calculate the score:     Age: 24 years     Sex: Female     Is Non-Hispanic African American: No     Diabetic: No     Tobacco smoker: No     Systolic Blood Pressure: [id number]     Is BP treated: Yes     HDL Cholesterol: 36 mg/dL     Total Cholesterol: 247 mg/dL   Review of Systems     Objective:   Physical Exam Gen-NAD not toxic TMS-normal bilateral T- normal no redness Chest-CTA respiratory rate normal no crackles CV RRR no murmur Skin-warm dry Neuro-grossly normal   Lab was reviewed with the patient in detail cholesterol significantly elevated kidney function liver function thyroid all look good.  Glucose minimally elevated.     Assessment & Plan:  1. Primary hypertension Blood pressure decent control good reading when she first came in slightly higher when I checked it.  Recommended fitting in regular walking for exercise as well as cutting back on salt use continue with blood pressure medicine follow-up within 6 months  2. Other hyperlipidemia Patient does not tolerate statins or Zetia.  Will consider injectables but insurance companies do not always do a good job covering this.  Patient does not want to be on additional medicine unless necessary We will move forward with coronary calcium testing  3. Acute rhinosinusitis Patient was seen today for upper respiratory illness. It is felt that the patient is dealing with sinusitis.  Antibiotics were prescribed today.  Compliance discussed.  If worsening symptoms or progressive illness notify us.   If emergency call 911 or go to ER.  Portion control regular physical  activity Colonoscopy patient states she lost her papers from Dr. Jenetta Downer

## 2022-05-31 NOTE — Patient Instructions (Signed)
Hi Faith Mahoney  The hospital will be calling you to set up the coronary calcium test We will let you know the results when it arrives with [place]  Please work hard at getting at walking or elliptical use 3 to 4 days/week Also be mindful of salt use  Follow-up in 6 months sooner if any problems please take care-Dr. Sallee Lange

## 2022-05-31 NOTE — Addendum Note (Signed)
Addended by: Sallee Lange A on: 05/31/2022 02:43 PM   Modules accepted: Orders

## 2022-07-21 ENCOUNTER — Other Ambulatory Visit: Payer: Self-pay | Admitting: Family Medicine

## 2022-11-09 ENCOUNTER — Encounter (INDEPENDENT_AMBULATORY_CARE_PROVIDER_SITE_OTHER): Payer: Self-pay | Admitting: *Deleted

## 2022-11-10 ENCOUNTER — Other Ambulatory Visit: Payer: Self-pay | Admitting: Family Medicine

## 2022-11-29 ENCOUNTER — Encounter: Payer: Self-pay | Admitting: *Deleted

## 2022-11-29 ENCOUNTER — Encounter (INDEPENDENT_AMBULATORY_CARE_PROVIDER_SITE_OTHER): Payer: Self-pay | Admitting: *Deleted

## 2022-11-29 ENCOUNTER — Encounter: Payer: Self-pay | Admitting: Family Medicine

## 2022-11-29 ENCOUNTER — Ambulatory Visit (INDEPENDENT_AMBULATORY_CARE_PROVIDER_SITE_OTHER): Payer: 59 | Admitting: Family Medicine

## 2022-11-29 VITALS — BP 134/84 | HR 82 | Temp 97.7°F | Wt 163.4 lb

## 2022-11-29 DIAGNOSIS — I10 Essential (primary) hypertension: Secondary | ICD-10-CM

## 2022-11-29 DIAGNOSIS — Z1211 Encounter for screening for malignant neoplasm of colon: Secondary | ICD-10-CM

## 2022-11-29 DIAGNOSIS — E7849 Other hyperlipidemia: Secondary | ICD-10-CM | POA: Diagnosis not present

## 2022-11-29 DIAGNOSIS — K219 Gastro-esophageal reflux disease without esophagitis: Secondary | ICD-10-CM

## 2022-11-29 DIAGNOSIS — Z7184 Encounter for health counseling related to travel: Secondary | ICD-10-CM

## 2022-11-29 MED ORDER — SCOPOLAMINE 1 MG/3DAYS TD PT72: 1.0000 | MEDICATED_PATCH | TRANSDERMAL | 4 refills | Status: DC

## 2022-11-29 MED ORDER — PANTOPRAZOLE SODIUM 40 MG PO TBEC: 40.0000 mg | DELAYED_RELEASE_TABLET | Freq: Every day | ORAL | 1 refills | Status: DC

## 2022-11-29 MED ORDER — ALPRAZOLAM 0.5 MG PO TABS: ORAL_TABLET | ORAL | 2 refills | Status: DC

## 2022-11-29 MED ORDER — FENOFIBRATE 160 MG PO TABS: ORAL_TABLET | ORAL | 5 refills | Status: DC

## 2022-11-29 MED ORDER — LISINOPRIL 20 MG PO TABS: 20.0000 mg | ORAL_TABLET | Freq: Every day | ORAL | 5 refills | Status: DC

## 2022-11-29 NOTE — Progress Notes (Signed)
   Subjective:    Patient ID: Faith Mahoney, female    DOB: 1960/12/01, 62 y.o.   MRN: [id number]  HPI Patient for blood pressure check up.  The patient does have hypertension.   Patient relates dietary measures try to minimize salt The importance of healthy diet and activity were discussed Patient relates compliance  Patient does have ongoing trouble with reflux.  Takes medication on a regular basis.  Tries to minimize foods as best they can.  They understand the importance of dietary compliance.  May also try to avoid eating a large meal close to bedtime.  Patient denies any dysphagia denies hematochezia.  States medicine does a good job keeping the problem under good control.  Without the medication may certainly have issues.They desire to continue taking their medication.  Patient relates that she is doing a good job of watching how she eats states she is also trying to fit in activity to some degree plus also she is utilizing her medicines on a regular basis  Review of Systems     Objective:   Physical Exam General-in no acute distress Eyes-no discharge Lungs-respiratory rate normal, CTA CV-no murmurs,RRR Extremities skin warm dry no edema Neuro grossly normal Behavior normal, alert        Assessment & Plan:  1. Primary hypertension HTN- patient seen for follow-up regarding HTN.   Diet, medication compliance, appropriate labs and refills were completed.   Importance of keeping blood pressure under good control to lessen the risk of complications discussed Regular follow-up visits discussed Labs ordered  2. Gastroesophageal reflux disease without esophagitis The patient was seen today for GERD. Patient benefits from medication. Patient to continue medication. Keep all regular follow ups. Uses medication as needed  3. Other hyperlipidemia Working hard on diet taking fenofibrate We will go ahead and do coronary calcium score  4. Counseling about  travel Scopolamine patch as requested by patient for travel for cruise boat  Colonoscopy referral

## 2022-12-20 ENCOUNTER — Ambulatory Visit (HOSPITAL_COMMUNITY)
Admission: RE | Admit: 2022-12-20 | Discharge: 2022-12-20 | Disposition: A | Discharge status: Home / Self Care | Payer: 59 | Source: Ambulatory Visit | Attending: Family Medicine | Admitting: Family Medicine

## 2022-12-20 DIAGNOSIS — I10 Essential (primary) hypertension: Secondary | ICD-10-CM | POA: Insufficient documentation

## 2022-12-20 DIAGNOSIS — E7849 Other hyperlipidemia: Secondary | ICD-10-CM | POA: Insufficient documentation

## 2022-12-22 ENCOUNTER — Telehealth (INDEPENDENT_AMBULATORY_CARE_PROVIDER_SITE_OTHER): Payer: Self-pay | Admitting: Gastroenterology

## 2022-12-22 NOTE — Telephone Encounter (Signed)
Room 1 Thanks

## 2022-12-22 NOTE — Telephone Encounter (Signed)
Who is your primary care physician: Lilyan Punt  Reasons for the colonoscopy: recall  Have you had a colonoscopy before?  Yes 13 and 23 years ago  Do you have family history of colon cancer? Yes maternal grandmother  Previous colonoscopy with polyps removed? yes  Do you have a history colorectal cancer?   no  Are you diabetic? If yes, Type 1 or Type 2?    no  Do you have a prosthetic or mechanical heart valve? no  Do you have a pacemaker/defibrillator?   no  Have you had endocarditis/atrial fibrillation? no  Have you had joint replacement within the last 12 months?  no  Do you tend to be constipated or have to use laxatives? no  Do you have any history of drugs or alchohol?  no  Do you use supplemental oxygen?  nono  Have you had a stroke or heart attack within the last 6 months? no  Do you take weight loss medication?  no  For female patients: have you had a hysterectomy?  yes                                     are you post menopausal?       yes                                            do you still have your menstrual cycle? no      Do you take any blood-thinning medications such as: (aspirin, warfarin, Plavix, Aggrenox)  no  If yes we need the name, milligram, dosage and who is prescribing doctor  Current Outpatient Medications on File Prior to Visit  Medication Sig Dispense Refill   ALPRAZolam (XANAX) 0.5 MG tablet TAKE 1 TABLET BY MOUTH TWICE DAILY AS NEEDED 30 tablet 2   Calcium Carbonate-Vitamin D (CALCIUM + D PO) Take 1 tablet by mouth daily.     cetirizine (ZYRTEC) 10 MG tablet Take 1 tablet (10 mg total) by mouth daily. 90 tablet 3   clobetasol cream (TEMOVATE) 0.05 % Apply 1 application topically as needed. 30 g 2   fenofibrate 160 MG tablet TAKE 1 TABLET BY MOUTH EVERY DAY 30 tablet 5   glucosamine-chondroitin 500-400 MG tablet Take 1 tablet by mouth daily.     lisinopril (ZESTRIL) 20 MG tablet Take 1 tablet (20 mg total) by mouth daily. 30 tablet 5    loratadine (CLARITIN) 10 MG tablet Take 10 mg by mouth daily as needed. For allergies     Menthol, Topical Analgesic, 4 % GEL Apply 1 application topically as needed. For pain     Multiple Vitamin (MULTIVITAMIN WITH MINERALS) TABS Take 1 tablet by mouth daily.     Omega-3 Fatty Acids (OMEGA 3 PO) Take by mouth.     pantoprazole (PROTONIX) 40 MG tablet Take 1 tablet (40 mg total) by mouth daily. 90 tablet 1   scopolamine (TRANSDERM-SCOP) 1 MG/3DAYS Place 1 patch (1.5 mg total) onto the skin every 3 (three) days. 4 patch 4   vitamin C (ASCORBIC ACID) 500 MG tablet Take 500 mg by mouth daily.     No current facility-administered medications on file prior to visit.    Allergies  Allergen Reactions   Sulfa Antibiotics Hives   Peanuts [Peanut Oil] Itching  and Rash    Can tolerate them every now and then   Statins     arthralgia   Zetia [Ezetimibe]     Head congestion facial discomfort     Pharmacy: [facility]   Primary Insurance Name: [employer] number where you can be reached: [phone]  *Please note-Due to a hernia surgery in 1978 I have a lot of scar tissue which causes great pain during colonoscopy. Dr.Rehman used full anesthesia for both procedures previously done. Thank you! Velna Hatchet

## 2022-12-31 NOTE — Telephone Encounter (Signed)
LMOVM to call back to schedule 

## 2023-01-02 ENCOUNTER — Encounter: Payer: Self-pay | Admitting: Family Medicine

## 2023-01-02 DIAGNOSIS — I700 Atherosclerosis of aorta: Secondary | ICD-10-CM | POA: Insufficient documentation

## 2023-01-03 NOTE — Telephone Encounter (Signed)
Pt left voicemail returning call. Returned call to patient but had to leave voicemail to return call

## 2023-01-13 ENCOUNTER — Telehealth: Payer: Self-pay | Admitting: Family Medicine

## 2023-01-13 NOTE — Telephone Encounter (Signed)
Spoke with patient and gastro office regarding appt request, pt will try messaging via mychart , scheduler is out of the office at this time per front desk staff.

## 2023-01-13 NOTE — Telephone Encounter (Signed)
Patient sent my chart message wanting  a referral to get colonoscopy done in November if  possible.

## 2023-03-15 ENCOUNTER — Other Ambulatory Visit (HOSPITAL_COMMUNITY): Payer: Self-pay | Admitting: Family Medicine

## 2023-03-15 DIAGNOSIS — Z1231 Encounter for screening mammogram for malignant neoplasm of breast: Secondary | ICD-10-CM

## 2023-03-17 ENCOUNTER — Encounter (HOSPITAL_COMMUNITY): Payer: Self-pay

## 2023-03-17 ENCOUNTER — Ambulatory Visit (HOSPITAL_COMMUNITY)
Admission: RE | Admit: 2023-03-17 | Discharge: 2023-03-17 | Disposition: A | Discharge status: Home / Self Care | Payer: 59 | Source: Ambulatory Visit | Attending: Family Medicine | Admitting: Family Medicine

## 2023-03-17 ENCOUNTER — Inpatient Hospital Stay (HOSPITAL_COMMUNITY): Admission: RE | Admit: 2023-03-17 | Payer: 59 | Source: Ambulatory Visit

## 2023-03-17 DIAGNOSIS — Z1231 Encounter for screening mammogram for malignant neoplasm of breast: Secondary | ICD-10-CM | POA: Insufficient documentation

## 2023-03-30 ENCOUNTER — Other Ambulatory Visit: Payer: Self-pay | Admitting: Family Medicine

## 2023-06-01 ENCOUNTER — Encounter (INDEPENDENT_AMBULATORY_CARE_PROVIDER_SITE_OTHER): Payer: Self-pay | Admitting: *Deleted

## 2023-08-18 ENCOUNTER — Other Ambulatory Visit: Payer: Self-pay | Admitting: Family Medicine

## 2023-09-28 ENCOUNTER — Other Ambulatory Visit: Payer: Self-pay | Admitting: Family Medicine

## 2024-01-04 ENCOUNTER — Other Ambulatory Visit: Payer: Self-pay | Admitting: Family Medicine

## 2024-01-05 ENCOUNTER — Telehealth: Payer: Self-pay | Admitting: Family Medicine

## 2024-01-05 NOTE — Telephone Encounter (Signed)
 Nurses I did send them 90-day supply of fenofibrate  as requested by her pharmacy  She is due for blood work and a urine test as well as an office visit  Please connect with patient let her know it has been 1 year She needs to do the following  Lipid, liver, metabolic 7, urine ACR  Diagnosis hyperlipidemia, high risk med, hypertension  Please also have her set up an appointment with myself or Elveria before the end of the year thank you

## 2024-01-06 NOTE — Telephone Encounter (Signed)
 Left message to return call

## 2024-01-10 ENCOUNTER — Other Ambulatory Visit: Payer: Self-pay

## 2024-01-10 DIAGNOSIS — I10 Essential (primary) hypertension: Secondary | ICD-10-CM

## 2024-01-10 DIAGNOSIS — E7849 Other hyperlipidemia: Secondary | ICD-10-CM

## 2024-01-18 ENCOUNTER — Encounter (INDEPENDENT_AMBULATORY_CARE_PROVIDER_SITE_OTHER): Payer: Self-pay | Admitting: Gastroenterology

## 2024-02-07 ENCOUNTER — Ambulatory Visit: Payer: Self-pay | Admitting: Family Medicine

## 2024-02-07 LAB — BASIC METABOLIC PANEL WITH GFR
BUN/Creatinine Ratio: 15 (ref 12–28)
BUN: 11 mg/dL (ref 8–27)
CO2: 20 mmol/L (ref 20–29)
Calcium: 9.7 mg/dL (ref 8.7–10.3)
Chloride: 104 mmol/L (ref 96–106)
Creatinine, Ser: 0.74 mg/dL (ref 0.57–1.00)
Glucose: 84 mg/dL (ref 70–99)
Potassium: 4.1 mmol/L (ref 3.5–5.2)
Sodium: 140 mmol/L (ref 134–144)
eGFR: 91 mL/min/1.73 (ref >59)

## 2024-02-07 LAB — LIPID PANEL
Chol/HDL Ratio: 6.5 ratio — ABNORMAL HIGH (ref 0.0–4.4)
Cholesterol, Total: 247 mg/dL — ABNORMAL HIGH (ref 100–199)
HDL: 38 mg/dL — ABNORMAL LOW (ref >39)
LDL Chol Calc (NIH): 188 mg/dL — ABNORMAL HIGH (ref 0–99)
Triglycerides: 116 mg/dL (ref 0–149)
VLDL Cholesterol Cal: 21 mg/dL (ref 5–40)

## 2024-02-07 LAB — MICROALBUMIN / CREATININE URINE RATIO
Creatinine, Urine: 86.3 mg/dL
Microalb/Creat Ratio: 7 mg/g{creat} (ref 0–29)
Microalbumin, Urine: 6.1 ug/mL

## 2024-02-07 LAB — HEPATIC FUNCTION PANEL
ALT: 19 IU/L (ref 0–32)
AST: 22 IU/L (ref 0–40)
Albumin: 4.4 g/dL (ref 3.9–4.9)
Alkaline Phosphatase: 39 IU/L — ABNORMAL LOW (ref 49–135)
Bilirubin Total: 0.6 mg/dL (ref 0.0–1.2)
Bilirubin, Direct: 0.18 mg/dL (ref 0.00–0.40)
Total Protein: 7.1 g/dL (ref 6.0–8.5)

## 2024-02-17 ENCOUNTER — Encounter: Payer: Self-pay | Admitting: Family Medicine

## 2024-02-17 ENCOUNTER — Other Ambulatory Visit: Payer: Self-pay

## 2024-02-17 DIAGNOSIS — C189 Malignant neoplasm of colon, unspecified: Secondary | ICD-10-CM

## 2024-02-17 NOTE — Telephone Encounter (Signed)
 Nurses Please go ahead with referral as requested for colonoscopy thank you

## 2024-02-24 ENCOUNTER — Other Ambulatory Visit: Payer: Self-pay | Admitting: Family Medicine

## 2024-02-24 ENCOUNTER — Ambulatory Visit: Payer: Self-pay | Admitting: Nurse Practitioner

## 2024-02-24 VITALS — BP 142/80 | HR 75 | Ht 61.75 in | Wt 160.2 lb

## 2024-02-24 DIAGNOSIS — E78019 Familial hypercholesterolemia, unspecified: Secondary | ICD-10-CM

## 2024-02-24 DIAGNOSIS — Z0001 Encounter for general adult medical examination with abnormal findings: Secondary | ICD-10-CM

## 2024-02-24 DIAGNOSIS — Z23 Encounter for immunization: Secondary | ICD-10-CM | POA: Diagnosis not present

## 2024-02-24 DIAGNOSIS — F419 Anxiety disorder, unspecified: Secondary | ICD-10-CM

## 2024-02-24 DIAGNOSIS — K219 Gastro-esophageal reflux disease without esophagitis: Secondary | ICD-10-CM

## 2024-02-24 DIAGNOSIS — I10 Essential (primary) hypertension: Secondary | ICD-10-CM | POA: Diagnosis not present

## 2024-02-24 DIAGNOSIS — Z0000 Encounter for general adult medical examination without abnormal findings: Secondary | ICD-10-CM

## 2024-02-24 DIAGNOSIS — Z1211 Encounter for screening for malignant neoplasm of colon: Secondary | ICD-10-CM

## 2024-02-24 DIAGNOSIS — Z789 Other specified health status: Secondary | ICD-10-CM | POA: Insufficient documentation

## 2024-02-24 MED ORDER — ALPRAZOLAM 0.5 MG PO TABS: ORAL_TABLET | ORAL | 2 refills | Status: DC

## 2024-02-24 MED ORDER — PANTOPRAZOLE SODIUM 40 MG PO TBEC: 40.0000 mg | DELAYED_RELEASE_TABLET | Freq: Every day | ORAL | 1 refills | Status: AC

## 2024-02-24 MED ORDER — LISINOPRIL 20 MG PO TABS: 20.0000 mg | ORAL_TABLET | Freq: Every day | ORAL | 1 refills | Status: DC

## 2024-02-24 MED ORDER — REPATHA SURECLICK 140 MG/ML ~~LOC~~ SOAJ: 140.0000 mg | SUBCUTANEOUS | 2 refills | Status: DC

## 2024-02-24 MED ORDER — FENOFIBRATE 160 MG PO TABS: 160.0000 mg | ORAL_TABLET | Freq: Every day | ORAL | 0 refills | Status: AC

## 2024-02-24 NOTE — Progress Notes (Unsigned)
 Subjective:    Patient ID: Faith Mahoney, female    DOB: [birth year], 63 y.o.   MRN: [id number]  HPI The patient comes in today for a wellness visit.  A review of their health history was completed. A review of medications was also completed.  Any needed refills; Yes,   Eating habits: Good  Falls/  MVA accidents in past few months: No   Regular exercise: Yes,   Sleep: Good  Menstrual cycles/sexual history: Had a total hysterectomy. No bleeding or concerns. Same female sexual partner.   Specialist pt sees on regular basis: Yes, she receives hormone therapy at [facility].   Regular eye/dental exams: Yes  Preventative health issues were discussed. Has tried to schedule colonoscopy in the last year, but did not hear back. Wants a referral for that.   Takes a rare Xanax  for anxiety. Needs refill.  Takes Pantoprazole  for GERD.  Additional concerns:  Has as a history of familial hypercholesterolemia. Concerned about labs regarding high cholesterol. Has tried statins in the past. Was not able to take due to severe muscle pain. Was referred to Cardiology, and put on Zetia . Did not tolerate Zetia  either d/t jaw pain. Would like education regarding Repatha , as she is worried about side effects.   Review of Systems  Constitutional:  Negative for fatigue and unexpected weight change.  HENT:  Negative for sore throat and trouble swallowing.   Eyes:  Negative for visual disturbance.  Respiratory:  Negative for cough, chest tightness, shortness of breath and wheezing.   Cardiovascular:  Negative for chest pain, palpitations and leg swelling.  Gastrointestinal:  Negative for abdominal distention, abdominal pain, constipation, diarrhea, nausea and vomiting.  Genitourinary:  Negative for difficulty urinating, dysuria, enuresis, frequency, genital sores, pelvic pain, urgency and vaginal discharge.  Neurological:  Negative for headaches.  Psychiatric/Behavioral:  Negative for sleep disturbance  and suicidal ideas.        Objective:   Physical Exam Vitals and nursing note reviewed.  Constitutional:      General: She is not in acute distress.    Appearance: Normal appearance. She is not ill-appearing.  Neck:     Thyroid: No thyroid mass, thyromegaly or thyroid tenderness.  Cardiovascular:     Rate and Rhythm: Normal rate and regular rhythm.     Heart sounds: Normal heart sounds, S1 normal and S2 normal. No murmur heard. Pulmonary:     Effort: Pulmonary effort is normal. No respiratory distress.     Breath sounds: Normal breath sounds. No wheezing.  Abdominal:     General: There is no distension.     Palpations: Abdomen is soft. There is no mass.     Tenderness: There is no abdominal tenderness.     Comments: No obvious masses felt.   Lymphadenopathy:     Cervical: No cervical adenopathy.  Skin:    General: Skin is warm and dry.  Neurological:     Mental Status: She is alert and oriented to person, place, and time. Mental status is at baseline.  Psychiatric:        Mood and Affect: Mood normal.        Behavior: Behavior normal.        Thought Content: Thought content normal.        Judgment: Judgment normal.    Today's Vitals   02/24/24 1341 02/24/24 1350  BP: (!) 143/78 (!) 142/80  Pulse: 75   SpO2: 98%   Weight: 160 lb 4 oz (72.7 kg)  Height: 5' 1.75 (1.568 m)    Body mass index is 29.55 kg/m.      02/24/2024    1:42 PM 11/29/2022    2:06 PM 05/31/2022    1:59 PM 01/29/2022    1:27 PM 01/29/2022    9:48 AM  Depression screen PHQ 2/9  Decreased Interest 0 0 0 0 0  Down, Depressed, Hopeless 0 0 0 0 0  PHQ - 2 Score 0 0 0 0 0  Altered sleeping  0  0   Tired, decreased energy  0  0   Change in appetite  0  0   Feeling bad or failure about yourself   0  0   Trouble concentrating  0  0   Moving slowly or fidgety/restless  0  0   Suicidal thoughts  0  0   PHQ-9 Score  0   0       Data saved with a previous flowsheet row definition        02/24/2024    1:42 PM 11/29/2022    2:06 PM 05/31/2022    1:59 PM 01/29/2022    1:27 PM  GAD 7 : Generalized Anxiety Score  Nervous, Anxious, on Edge 0 0 0 0  Control/stop worrying 0 0 0 0  Worry too much - different things 0 0 0 0  Trouble relaxing 0 0 0 0  Restless 0 0 0 0  Easily annoyed or irritable 0 0  0  Afraid - awful might happen 0 0 0 0  Total GAD 7 Score 0 0  0  Anxiety Difficulty Not difficult at all      Recent Results (from the past 2160 hours)  Lipid Profile     Status: Abnormal   Collection Time: 02/06/24 11:41 AM  Result Value Ref Range   Cholesterol, Total 247 (H) 100 - 199 mg/dL   Triglycerides 883 0 - 149 mg/dL   HDL 38 (L) >60 mg/dL   VLDL Cholesterol Cal 21 5 - 40 mg/dL   LDL Chol Calc (NIH) 811 (H) 0 - 99 mg/dL   Chol/HDL Ratio 6.5 (H) 0.0 - 4.4 ratio    Comment:                                   T. Chol/HDL Ratio                                             Men  Women                               1/2 Avg.Risk  3.4    3.3                                   Avg.Risk  5.0    4.4                                2X Avg.Risk  9.6    7.1  3X Avg.Risk 23.4   11.0   Hepatic function panel     Status: Abnormal   Collection Time: 02/06/24 11:41 AM  Result Value Ref Range   Total Protein 7.1 6.0 - 8.5 g/dL   Albumin 4.4 3.9 - 4.9 g/dL   Bilirubin Total 0.6 0.0 - 1.2 mg/dL   Bilirubin, Direct 9.81 0.00 - 0.40 mg/dL   Alkaline Phosphatase 39 (L) 49 - 135 IU/L   AST 22 0 - 40 IU/L   ALT 19 0 - 32 IU/L  Basic Metabolic Panel (BMET)     Status: None   Collection Time: 02/06/24 11:41 AM  Result Value Ref Range   Glucose 84 70 - 99 mg/dL   BUN 11 8 - 27 mg/dL   Creatinine, Ser 9.25 0.57 - 1.00 mg/dL   eGFR 91 >40 fO/fpw/8.26   BUN/Creatinine Ratio 15 12 - 28   Sodium 140 134 - 144 mmol/L   Potassium 4.1 3.5 - 5.2 mmol/L   Chloride 104 96 - 106 mmol/L   CO2 20 20 - 29 mmol/L   Calcium  9.7 8.7 - 10.3 mg/dL  Urine Microalbumin  w/creat. ratio     Status: None   Collection Time: 02/06/24 11:41 AM  Result Value Ref Range   Creatinine, Urine 86.3 Not Estab. mg/dL   Microalbumin, Urine 6.1 Not Estab. ug/mL   Microalb/Creat Ratio 7 0 - 29 mg/g creat    Comment:                        Normal:                0 -  29                        Moderately increased: 30 - 300                        Severely increased:       >300        Assessment & Plan:  1. Well woman exam without gynecological exam (Primary) Adult wellness-complete.wellness physical was conducted today. Importance of diet and exercise were discussed in detail.  Importance of stress reduction and healthy living were discussed.  In addition to this a discussion regarding safety was also covered.  We also reviewed over immunizations and gave recommendations regarding current immunization needed for age.   In addition to this additional areas were also touched on including: Preventative health exams needed:  Colonoscopy  Patient was advised yearly wellness exam  - Ambulatory referral to Gastroenterology - Pneumococcal conjugate vaccine 20-valent (Prevnar 20)  2. Primary hypertension -Patient reports that blood pressure is not normally that high at home, and it could be stress. Advised patient to keep a blood pressure log at home, and message us  on MyChart with the readings. May need to alter dose of medication if consistently high.  - lisinopril  (ZESTRIL ) 20 MG tablet; Take 1 tablet (20 mg total) by mouth daily.  Dispense: 90 tablet; Refill: 1  3. Familial hypercholesterolemia, unspecified type Discussed Repatha . Cardiology recommended this years ago but cost prohibitive at that time. Is willing to try this now.  - fenofibrate  160 MG tablet; Take 1 tablet (160 mg total) by mouth daily.  Dispense: 90 tablet; Refill: 0 - Lipid panel to be repeated in 8-10 weeks after starting Repatha . - Evolocumab  (REPATHA  SURECLICK) 140 MG/ML SOAJ; Inject 140 mg into  the skin every 14 (fourteen) days.  Dispense: 2 mL; Refill: 2  4. Screening for colon cancer  - Ambulatory referral to Gastroenterology  5. Statin intolerance -See above.  - Lipid panel - Evolocumab  (REPATHA  SURECLICK) 140 MG/ML SOAJ; Inject 140 mg into the skin every 14 (fourteen) days.  Dispense: 2 mL; Refill: 2  6. Immunization due  - Pneumococcal conjugate vaccine 20-valent (Prevnar 20)  7. Anxiousness  - ALPRAZolam  (XANAX ) 0.5 MG tablet; TAKE 1 TABLET BY MOUTH TWICE DAILY AS NEEDED  Dispense: 30 tablet; Refill: 2  8. Gastroesophageal reflux disease without esophagitis  - pantoprazole  (PROTONIX ) 40 MG tablet; Take 1 tablet (40 mg total) by mouth daily.  Dispense: 90 tablet; Refill: 1   Return in about 6 months (around 08/23/2024). Repeat physical in one year.

## 2024-02-27 ENCOUNTER — Telehealth: Payer: Self-pay | Admitting: Pharmacist

## 2024-02-27 ENCOUNTER — Encounter: Payer: Self-pay | Admitting: Nurse Practitioner

## 2024-02-27 DIAGNOSIS — E78019 Familial hypercholesterolemia, unspecified: Secondary | ICD-10-CM

## 2024-02-27 NOTE — Telephone Encounter (Signed)
 Lipid Panel     Component Value Date/Time   CHOL 247 (H) 02/06/2024 1141   TRIG 116 02/06/2024 1141   HDL 38 (L) 02/06/2024 1141   CHOLHDL 6.5 (H) 02/06/2024 1141   CHOLHDL 4.1 09/13/2017 0854   VLDL 25 09/13/2017 0854   LDLCALC 188 (H) 02/06/2024 1141   LABVLDL 21 02/06/2024 1141    Patient unable to tolerate statins or ezetimibe .  LDL 188 (has been >200), likely familial and needs PCSK9. CAC score was 0, but Aortic Atherosclerosis (ICD10-I70.0) found.  Pharmacy referral for medication management and assistance. [id number] pending.   Lateya Dauria Dattero Jaeson Molstad, PharmD, BCACP, CPP Clinical Pharmacist, [facility]

## 2024-03-05 NOTE — Telephone Encounter (Signed)
 So noted-please see message that I previously sent to the patient

## 2024-03-05 NOTE — Telephone Encounter (Unsigned)
 Copied from CRM #[id number]. Topic: General - Other >> Mar 05, 2024  9:52 AM Tobias CROME wrote: Reason for CRM: Patient states she is not interested in starting repatha  injections. Patient states she had scan completed and it looks like everything is fine. Patient is not interested in referral for population health as well.

## 2024-03-08 ENCOUNTER — Telehealth: Payer: Self-pay

## 2024-03-08 NOTE — Progress Notes (Signed)
 Care Guide Pharmacy Note  03/08/2024 Name: Faith Mahoney MRN: [id number] DOB: May 12, 1960  Referred By: Alphonsa Glendia LABOR, MD Reason for referral: Complex Care Management (Outreach to schedule with Pharm d )   Faith Mahoney is a 63 y.o. year old female who is a primary care patient of Luking, Glendia LABOR, MD.  Faith Mahoney was referred to the pharmacist for assistance related to: HLD  Successful contact was made with the patient to discuss pharmacy services including being ready for the pharmacist to call at least 5 minutes before the scheduled appointment time and to have medication bottles and any blood pressure readings ready for review. The patient agreed to meet with the pharmacist via telephone visit on (date/time).03/19/2024  Jeoffrey Buffalo , RMA     [place]  [facility], [facility] Guide  Direct Dial: [phone]  Website: [email or web address]

## 2024-03-08 NOTE — Progress Notes (Signed)
 Care Guide Pharmacy Note  03/08/2024 Name: MARISOL GIAMBRA MRN: [id number] DOB: 05-03-1960  Referred By: Alphonsa Glendia LABOR, MD Reason for referral: Complex Care Management (Outreach to schedule with Pharm d )   ASHLYN CABLER is a 63 y.o. year old female who is a primary care patient of Luking, Glendia LABOR, MD.  Holli GORMAN Kipper was referred to the pharmacist for assistance related to: HLD  An unsuccessful telephone outreach was attempted today to contact the patient who was referred to the pharmacy team for assistance with medication management. Additional attempts will be made to contact the patient.  Jeoffrey Buffalo , RMA     [facility]  [facility], [facility] Guide  Direct Dial: [phone]  Website: [email or web address]

## 2024-03-13 ENCOUNTER — Encounter (INDEPENDENT_AMBULATORY_CARE_PROVIDER_SITE_OTHER): Payer: Self-pay | Admitting: *Deleted

## 2024-03-19 ENCOUNTER — Other Ambulatory Visit (INDEPENDENT_AMBULATORY_CARE_PROVIDER_SITE_OTHER)

## 2024-03-19 VITALS — Ht 61.75 in | Wt 160.0 lb

## 2024-03-19 DIAGNOSIS — E785 Hyperlipidemia, unspecified: Secondary | ICD-10-CM

## 2024-03-19 DIAGNOSIS — G720 Drug-induced myopathy: Secondary | ICD-10-CM

## 2024-03-19 NOTE — Progress Notes (Signed)
 "  03/19/2024 Name: Faith Mahoney MRN: [id number] DOB: 13-Dec-1960  Chief Complaint  Patient presents with   Hyperlipidemia    SONNI BARSE is a 63 y.o. year old female who presented for a telephone visit.  I connected with  Holli GORMAN Kipper on 03/19/2024 by telephone and verified that I am speaking with the correct person using two identifiers.  I discussed the limitations of evaluation and management by telemedicine. The patient expressed understanding and agreed to proceed.  Patient was located in her home and PharmD in PCP office during this visit.   They were referred to the pharmacist by their PCP for assistance in managing hyperlipidemia/cardiovascular risk reduction.   Subjective:  Patient has had difficulties with statin intolerance.  She experienced muscle pain and weakness with statins, ezetimibe  & Welchol.  These symptoms subsided when she discontinued these medications.  Her LDL remains elevated >180.   Care Team: Primary Care Provider: Alphonsa Glendia LABOR, MD   Medication Access/Adherence  Current Pharmacy:  [facility] [address] [place] - [address] [place] [address] [place] [address] Phone: [phone] Fax: [phone]  Patient reports affordability concerns with their medications: No  Patient reports access/transportation concerns to their pharmacy: No  Patient reports adherence concerns with their medications:  No     Hyperlipidemia/ASCVD Risk Reduction  Current lipid lowering medications: none Medications tried in the past: rosuvastatin  (including alternative dosing), ezetimibe , Welchol CAC score 0, but aortic atherosclerosis   Antiplatelet regimen: none  ASCVD History: n/a Family History: grandfather MI in 32s (alcoholic) Risk Factors:  HTN, LDL 188  Clinical ASCVD: No  The 10-year ASCVD risk score (Arnett DK, et al., 2019) is: 10.2%   Values used to calculate the score:     Age: 75 years     Clinically relevant sex:  Female     Is Non-Hispanic African American: No     Diabetic: No     Tobacco smoker: No     Systolic Blood Pressure: 142 mmHg     Is BP treated: Yes     HDL Cholesterol: 38 mg/dL     Total Cholesterol: 247 mg/dL   Current physical activity: working to increase  Current medication access support: medicare   PREVENT Risk Score:  [email or web address] - 10 year risk of CVD: 10% - 10 year risk of ASCVD: 7.6% - 10 year risk of HF: 3.9%  Objective:  Lab Results  Component Value Date   HGBA1C 5.4 05/14/2015    Lab Results  Component Value Date   CREATININE 0.74 02/06/2024   BUN 11 02/06/2024   NA 140 02/06/2024   K 4.1 02/06/2024   CL 104 02/06/2024   CO2 20 02/06/2024    Lab Results  Component Value Date   CHOL 247 (H) 02/06/2024   HDL 38 (L) 02/06/2024   LDLCALC 188 (H) 02/06/2024   TRIG 116 02/06/2024   CHOLHDL 6.5 (H) 02/06/2024    Medications Reviewed Today     Reviewed by Billee Mliss BIRCH, RPH-CPP (Pharmacist) on 03/19/24 at 1053  Med List Status: <None>   Medication Order Taking? Sig Documenting Provider Last Dose Status Informant  ALPRAZolam  (XANAX ) 0.5 MG tablet [id number]  TAKE 1 TABLET BY MOUTH TWICE DAILY AS NEEDED Gerard Damien NOVAK, FNP  Active   Calcium  Carbonate-Vitamin D (CALCIUM  + D PO) [id number]  Take 1 tablet by mouth daily. [provider]  Active Self  cetirizine  (ZYRTEC ) 10 MG tablet [id number]  Take 1 tablet (  10 mg total) by mouth daily. Alphonsa Glendia LABOR, MD  Active   clobetasol  cream (TEMOVATE ) 0.05 % [id number]  Apply 1 application topically as needed. Alphonsa Glendia LABOR, MD  Active     Discontinued 03/19/24 1053 (Change in therapy)   fenofibrate  160 MG tablet [id number]  Take 1 tablet (160 mg total) by mouth daily. Gerard Damien NOVAK, FNP  Active   glucosamine-chondroitin 500-400 MG tablet [id number]  Take 1 tablet by mouth daily. [provider]  Active Self   lisinopril  (ZESTRIL ) 20 MG tablet [id number]  Take 1 tablet (20 mg total) by mouth daily. Gerard Damien NOVAK, FNP  Active   loratadine (CLARITIN) 10 MG tablet [id number]  Take 10 mg by mouth daily as needed. For allergies [provider]  Active Self  Menthol, Topical Analgesic, 4 % GEL [id number]  Apply 1 application topically as needed. For pain [provider]  Active Self  Multiple Vitamin (MULTIVITAMIN WITH MINERALS) TABS [id number]  Take 1 tablet by mouth daily. [provider]  Active Self  Omega-3 Fatty Acids (OMEGA 3 PO) [id number]  Take by mouth. [provider]  Active   pantoprazole  (PROTONIX ) 40 MG tablet [id number]  Take 1 tablet (40 mg total) by mouth daily. Gerard Damien NOVAK, FNP  Active   scopolamine  (TRANSDERM-SCOP) 1 MG/3DAYS [id number]  Place 1 patch (1.5 mg total) onto the skin every 3 (three) days. Alphonsa Glendia LABOR, MD  Active   vitamin C (ASCORBIC ACID) 500 MG tablet [id number]  Take 500 mg by mouth daily. [provider]  Active Self              Assessment/Plan:   Hyperlipidemia/ASCVD Risk Reduction: - Currently uncontrolled. LDL 188 - Reviewed long term complications of uncontrolled cholesterol - Reviewed dietary recommendations including FOLLOWING A HEART HEALTHY DIET/HEALTHY PLATE METHOD - Reviewed lifestyle recommendations including increased physical activity - Recommend to start Nexletol   Will trial patient on samples   Discuss with PCP   Follow Up Plan: lipid panel  Mliss Tarry Griffin, PharmD, BCACP, CPP Clinical Pharmacist, [facility]     "

## 2024-03-30 ENCOUNTER — Other Ambulatory Visit: Payer: Self-pay | Admitting: Family Medicine

## 2024-03-30 DIAGNOSIS — I10 Essential (primary) hypertension: Secondary | ICD-10-CM

## 2024-04-02 ENCOUNTER — Encounter: Payer: Self-pay | Admitting: Pharmacist

## 2024-04-06 ENCOUNTER — Other Ambulatory Visit (HOSPITAL_COMMUNITY): Payer: Self-pay | Admitting: Family Medicine

## 2024-04-06 DIAGNOSIS — Z1231 Encounter for screening mammogram for malignant neoplasm of breast: Secondary | ICD-10-CM

## 2024-04-11 ENCOUNTER — Encounter (HOSPITAL_COMMUNITY): Payer: Self-pay

## 2024-04-11 ENCOUNTER — Ambulatory Visit (HOSPITAL_COMMUNITY)
Admission: RE | Admit: 2024-04-11 | Discharge: 2024-04-11 | Disposition: A | Discharge status: Home / Self Care | Source: Ambulatory Visit | Attending: Family Medicine | Admitting: Family Medicine

## 2024-04-11 DIAGNOSIS — Z1231 Encounter for screening mammogram for malignant neoplasm of breast: Secondary | ICD-10-CM | POA: Diagnosis present

## 2024-04-12 ENCOUNTER — Inpatient Hospital Stay
Admission: RE | Admit: 2024-04-12 | Discharge: 2024-04-12 | Disposition: A | Discharge status: Home / Self Care | Payer: Self-pay | Source: Ambulatory Visit | Attending: Family Medicine | Admitting: Family Medicine

## 2024-04-12 ENCOUNTER — Other Ambulatory Visit (HOSPITAL_COMMUNITY): Payer: Self-pay | Admitting: Family Medicine

## 2024-04-12 DIAGNOSIS — Z1231 Encounter for screening mammogram for malignant neoplasm of breast: Secondary | ICD-10-CM

## 2024-04-17 ENCOUNTER — Telehealth: Payer: Self-pay | Admitting: *Deleted

## 2024-04-17 NOTE — Telephone Encounter (Signed)
" °  Procedure: COLONOSCOPY  Height: 5'3 Weight: 157LBS       Have you had a colonoscopy before? 2013 Dr. golda    Do you have family history of colon cancer?  MGM  Do you have a family history of polyps? yes  Previous colonoscopy with polyps removed? unsure  Do you have a history colorectal cancer?   no  Are you diabetic?  no  Do you have a prosthetic or mechanical heart valve? no  Do you have a pacemaker/defibrillator?   no  Have you had endocarditis/atrial fibrillation?  no  Do you use supplemental oxygen/CPAP?  no  Have you had joint replacement within the last 12 months?  no  Do you tend to be constipated or have to use laxatives?  no   Do you have history of alcohol use? If yes, how much and how often.  no  Do you have history or are you using drugs? If yes, what do are you  using?  no  Have you ever had a stroke/heart attack?  no  Have you ever had a heart or other vascular stent placed,?no  Do you take weight loss medication? no  female patients,: have you had a hysterectomy? yes                              are you post menopausal?  yes                              do you still have your menstrual cycle?     Date of last menstrual period?   Do you take any blood-thinning medications such as: (Plavix, aspirin, Coumadin, Aggrenox, Brilinta, Xarelto, Eliquis, Pradaxa, Savaysa or Effient)? no  If yes we need the name, milligram, dosage and who is prescribing doctor:               Current Outpatient Medications  Medication Sig Dispense Refill   Boswellia-Glucosamine-Vit D (OSTEO BI-FLEX ONE PER DAY PO) Take by mouth.     cetirizine  (ZYRTEC ) 10 MG tablet Take 10 mg by mouth daily.     fenofibrate  160 MG tablet Take 1 tablet (160 mg total) by mouth daily. 90 tablet 0   lisinopril  (ZESTRIL ) 20 MG tablet TAKE 1 TABLET BY MOUTH EVERY DAY (Patient taking differently: Take 10 mg by mouth daily.) 90 tablet 1   Multiple Vitamin (MULTIVITAMIN WITH MINERALS) TABS Take 1  tablet by mouth daily.     pantoprazole  (PROTONIX ) 40 MG tablet Take 1 tablet (40 mg total) by mouth daily. (Patient taking differently: Take 40 mg by mouth. TWICE A WEEK) 90 tablet 1   vitamin C (ASCORBIC ACID) 500 MG tablet Take 500 mg by mouth daily.     No current facility-administered medications for this visit.    Allergies[1]      [1]  Allergies Allergen Reactions   Sulfa Antibiotics Hives   Peanuts [Peanut Oil] Itching and Rash    Can tolerate them every now and then   Statins     arthralgia   Zetia  [Ezetimibe ]     Head congestion facial discomfort   "

## 2024-05-03 NOTE — Telephone Encounter (Signed)
 Ok to schedule  ASA 2

## 2024-05-04 MED ORDER — PEG 3350-KCL-NA BICARB-NACL 420 G PO SOLR: 4000.0000 mL | Freq: Once | ORAL | 0 refills | Status: AC

## 2024-05-04 NOTE — Telephone Encounter (Signed)
 LMOVM to return call.

## 2024-05-04 NOTE — Addendum Note (Signed)
 Addended by: JEANELL GRAEME RAMAN on: 05/04/2024 10:57 AM   Modules accepted: Orders

## 2024-05-04 NOTE — Telephone Encounter (Addendum)
 Spoke with pt. She has been scheduled for 2/13. Aware will send instructions via mychart and rx for prep to pharmacy.  PA approved via [facility] CPT Code GECOL Description: Colonoscopy Authorization Number: [id number] Case Number: [id number] Review Date: 05/04/2024 10:54:47 AM Expiration Date: 08/02/2024 Status: Your case has been Approved.

## 2024-05-08 ENCOUNTER — Encounter (INDEPENDENT_AMBULATORY_CARE_PROVIDER_SITE_OTHER): Payer: Self-pay | Admitting: *Deleted

## 2024-05-08 NOTE — Telephone Encounter (Signed)
 Referral completed, TCS apt letter sent to PCP

## 2024-05-15 ENCOUNTER — Other Ambulatory Visit (HOSPITAL_COMMUNITY)

## 2024-05-18 ENCOUNTER — Encounter (HOSPITAL_COMMUNITY): Admission: RE | Payer: Self-pay

## 2024-05-18 ENCOUNTER — Ambulatory Visit (HOSPITAL_COMMUNITY): Admission: RE | Admit: 2024-05-18 | Admitting: Internal Medicine

## 2024-08-23 ENCOUNTER — Ambulatory Visit: Admitting: Nurse Practitioner
